# Patient Record
Sex: Female | Born: 1975 | Race: White | Hispanic: No | Smoking: Former smoker
Health system: Southern US, Community
[De-identification: ages and names within clinical notes are randomized; demographics above are authoritative.]

## PROBLEM LIST (undated history)

## (undated) ENCOUNTER — Inpatient Hospital Stay (HOSPITAL_COMMUNITY): Payer: Self-pay

## (undated) DIAGNOSIS — Z789 Other specified health status: Secondary | ICD-10-CM

## (undated) HISTORY — PX: HERNIA REPAIR: SHX51

## (undated) HISTORY — PX: GASTRIC BYPASS: SHX52

## (undated) HISTORY — PX: GALLBLADDER SURGERY: SHX652

---

## 1998-05-22 ENCOUNTER — Ambulatory Visit (HOSPITAL_COMMUNITY): Admission: RE | Admit: 1998-05-22 | Discharge: 1998-05-22 | Payer: Self-pay | Admitting: *Deleted

## 1998-06-17 ENCOUNTER — Ambulatory Visit (HOSPITAL_COMMUNITY): Admission: RE | Admit: 1998-06-17 | Discharge: 1998-06-17 | Payer: Self-pay | Admitting: *Deleted

## 1998-08-22 ENCOUNTER — Inpatient Hospital Stay (HOSPITAL_COMMUNITY): Admission: AD | Admit: 1998-08-22 | Discharge: 1998-08-22 | Payer: Self-pay | Admitting: *Deleted

## 1998-09-01 ENCOUNTER — Ambulatory Visit (HOSPITAL_COMMUNITY): Admission: RE | Admit: 1998-09-01 | Discharge: 1998-09-01 | Payer: Self-pay | Admitting: *Deleted

## 1998-10-04 ENCOUNTER — Inpatient Hospital Stay (HOSPITAL_COMMUNITY): Admission: AD | Admit: 1998-10-04 | Discharge: 1998-10-04 | Payer: Self-pay | Admitting: *Deleted

## 2011-01-17 ENCOUNTER — Inpatient Hospital Stay: Payer: Self-pay | Admitting: Obstetrics and Gynecology

## 2012-07-03 ENCOUNTER — Emergency Department (HOSPITAL_COMMUNITY)
Admission: EM | Admit: 2012-07-03 | Discharge: 2012-07-03 | Disposition: A | Discharge status: Home / Self Care | Payer: Self-pay | Attending: Emergency Medicine | Admitting: Emergency Medicine

## 2012-07-03 ENCOUNTER — Encounter (HOSPITAL_COMMUNITY): Payer: Self-pay | Admitting: *Deleted

## 2012-07-03 DIAGNOSIS — F17200 Nicotine dependence, unspecified, uncomplicated: Secondary | ICD-10-CM | POA: Insufficient documentation

## 2012-07-03 DIAGNOSIS — K089 Disorder of teeth and supporting structures, unspecified: Secondary | ICD-10-CM | POA: Insufficient documentation

## 2012-07-03 DIAGNOSIS — Z9884 Bariatric surgery status: Secondary | ICD-10-CM | POA: Insufficient documentation

## 2012-07-03 MED ORDER — HYDROCODONE-ACETAMINOPHEN 5-325 MG PO TABS: 1.0000 | ORAL_TABLET | ORAL | Status: DC | PRN

## 2012-07-03 MED ORDER — OMEPRAZOLE 20 MG PO CPDR: 20.0000 mg | DELAYED_RELEASE_CAPSULE | Freq: Every day | ORAL | Status: DC

## 2012-07-03 MED ORDER — PENICILLIN V POTASSIUM 500 MG PO TABS: 500.0000 mg | ORAL_TABLET | Freq: Four times a day (QID) | ORAL | Status: AC

## 2012-07-03 NOTE — ED Notes (Signed)
The pt has had a toothache for 2-3 weeks getting worse

## 2012-07-03 NOTE — ED Provider Notes (Signed)
History    This chart was scribed for Lyanne Co, MD by Gerlean Ren, ED Scribe. This patient was seen in room TR04C/TR04C and the patient's care was started at 9:39 PM    CSN: [id number]  Arrival date & time 07/03/12  2046   First MD Initiated Contact with Patient 07/03/12 2139      Chief Complaint  Patient presents with  . Dental Pain    The history is provided by the patient. No language interpreter was used.  Barbara Burton is a 37 y.o. female who presents to the Emergency Department complaining of 2 weeks of constant dental pain throughout entire mouth localized most specifically to upper left side that has been worsening over the past 2 days with associated left-sided facial swelling.  No OCM used for pain.  No fever.  Pt had prior gastric bypass surgery.  Past Medical History  Diagnosis Date  . Gastric bypass status for obesity     History reviewed. No pertinent past surgical history.  No family history on file.  History  Substance Use Topics  . Smoking status: Current Every Day Smoker  . Smokeless tobacco: Not on file  . Alcohol Use: No    No OB history provided.   Review of Systems A complete 10 system review of systems was obtained and all systems are negative except as noted in the HPI and PMH.   Allergies  Sulfa antibiotics  Home Medications   Current Outpatient Rx  Name  Route  Sig  Dispense  Refill  . acetaminophen (TYLENOL) 325 MG tablet   Oral   Take 650 mg by mouth every 6 (six) hours as needed for pain.           BP 118/68  Pulse 80  Temp(Src) 98.1 F (36.7 C) (Oral)  Resp 20  SpO2 100%  LMP 06/12/2012  Physical Exam  Nursing note and vitals reviewed. Constitutional: She is oriented to person, place, and time. She appears well-developed and well-nourished.  HENT:  Head: Normocephalic.  Left upper second molar tender, no gingival swelling, no gingival fluctuance  Eyes: EOM are normal.  Neck: Normal range of motion.  No  lymphadenopathy   Pulmonary/Chest: Effort normal.  Abdominal: She exhibits no distension.  Musculoskeletal: Normal range of motion.  Neurological: She is alert and oriented to person, place, and time.  Psychiatric: She has a normal mood and affect.    ED Course  Procedures (including critical care time) DIAGNOSTIC STUDIES: Oxygen Saturation is 100% on room air, normal by my interpretation.    COORDINATION OF CARE: 9:44 PM- Informed pt of plan to manage pain with pain medicine but that follow-up with dentist is needed.  Informed pt of financial assistance program here if dentist is contacted tomorrow.  Pt understands and agrees with plan.  1. Pain, dental       MDM  Dental Pain. Home with antibiotics and pain medicine. Recommend dental follow up. No signs of gingival abscess. Tolerating secretions. Airway patent. No sub lingular swelling   I personally performed the services described in this documentation, which was scribed in my presence. The recorded information has been reviewed and is accurate.          Lyanne Co, MD 07/03/12 2153

## 2013-02-05 ENCOUNTER — Inpatient Hospital Stay (HOSPITAL_COMMUNITY)
Admission: AD | Admit: 2013-02-05 | Discharge: 2013-02-05 | Disposition: A | Discharge status: Home / Self Care | Payer: Self-pay | Source: Ambulatory Visit | Attending: Obstetrics & Gynecology | Admitting: Obstetrics & Gynecology

## 2013-02-05 DIAGNOSIS — N926 Irregular menstruation, unspecified: Secondary | ICD-10-CM | POA: Insufficient documentation

## 2013-02-05 DIAGNOSIS — Z3202 Encounter for pregnancy test, result negative: Secondary | ICD-10-CM | POA: Insufficient documentation

## 2013-02-05 LAB — POCT PREGNANCY, URINE: Preg Test, Ur: NEGATIVE

## 2013-02-05 NOTE — MAU Provider Note (Signed)
Ms. Barbara Burton is a 37 y.o. female who presents to MAU today with concerns of possible pregnancy. The patient states that she did not have a period in June and had a abnormal period in July and August. LMP was 01/17/13 and was normal. The patient also states that she felt "a bump" in her lower abdomen that she has only felt previously in pregnancy. She denies any abdominal pain, vaginal bleeding or other GYN concerns today.   BP 121/66  Pulse 90  Temp(Src) 98.1 F (36.7 C) (Oral)  Resp 20  Ht 5' (1.524 m)  Wt 180 lb 2 oz (81.704 kg)  BMI 35.18 kg/m2 GENERAL: Well-developed, well-nourished female in no acute distress.  HEENT: Normocephalic, atraumatic.   LUNGS: Effort normal HEART: Regular rate  SKIN: Warm, dry and without erythema PSYCH: Normal mood and affect  Results for orders placed during the hospital encounter of 02/05/13 (from the past 24 hour(s))  POCT PREGNANCY, URINE     Status: None   Collection Time    02/05/13 11:35 PM      Result Value Range   Preg Test, Ur NEGATIVE  NEGATIVE    A: Negative pregnancy test Irregular menses  P: Discharge home Patient given contact information for [facility] clinic to make an appointment to establish care and further work-up of irregular cycles if she desires and symptoms persist Patient may return to MAU as needed  Freddi Starr, PA-C 02/05/2013 11:52 PM

## 2013-02-05 NOTE — MAU Note (Signed)
PT SAYS  NO CYCLE IN June,  IRREG CYCLE IN July  AND AUGUST,  HAD CYCLE IN 9-10.  LAST SEX- AUG.  NO BIRTH CONTROL, NO BLEEDING, NO PAIN.  HAD 4 BABIES IN [place] AND LAST PAP IN 2012[place].  DID 3 HPT-  June AND AUG-  ALL NEG.

## 2013-02-06 NOTE — MAU Provider Note (Signed)
Attestation of Attending Supervision of Advanced Practitioner (CNM/NP): Evaluation and management procedures were performed by the Advanced Practitioner under my supervision and collaboration.  I have reviewed the Advanced Practitioner's note and chart, and I agree with the management and plan.  HARRAWAY-Ringer, Laurie Penado 2:00 AM     

## 2013-05-22 ENCOUNTER — Encounter (HOSPITAL_COMMUNITY): Payer: Self-pay | Admitting: Emergency Medicine

## 2013-05-22 ENCOUNTER — Emergency Department (HOSPITAL_COMMUNITY)
Admission: EM | Admit: 2013-05-22 | Discharge: 2013-05-22 | Disposition: A | Discharge status: Home / Self Care | Payer: Medicaid Other | Attending: Emergency Medicine | Admitting: Emergency Medicine

## 2013-05-22 DIAGNOSIS — B349 Viral infection, unspecified: Secondary | ICD-10-CM

## 2013-05-22 DIAGNOSIS — IMO0001 Reserved for inherently not codable concepts without codable children: Secondary | ICD-10-CM | POA: Insufficient documentation

## 2013-05-22 DIAGNOSIS — F17200 Nicotine dependence, unspecified, uncomplicated: Secondary | ICD-10-CM | POA: Insufficient documentation

## 2013-05-22 DIAGNOSIS — R509 Fever, unspecified: Secondary | ICD-10-CM | POA: Insufficient documentation

## 2013-05-22 DIAGNOSIS — Z9884 Bariatric surgery status: Secondary | ICD-10-CM | POA: Insufficient documentation

## 2013-05-22 DIAGNOSIS — Z882 Allergy status to sulfonamides status: Secondary | ICD-10-CM | POA: Insufficient documentation

## 2013-05-22 DIAGNOSIS — R630 Anorexia: Secondary | ICD-10-CM | POA: Insufficient documentation

## 2013-05-22 DIAGNOSIS — R110 Nausea: Secondary | ICD-10-CM | POA: Insufficient documentation

## 2013-05-22 DIAGNOSIS — B9789 Other viral agents as the cause of diseases classified elsewhere: Secondary | ICD-10-CM | POA: Insufficient documentation

## 2013-05-22 MED ORDER — ONDANSETRON HCL 4 MG PO TABS: 4.0000 mg | ORAL_TABLET | Freq: Four times a day (QID) | ORAL | Status: DC

## 2013-05-22 MED ORDER — SODIUM CHLORIDE 0.9 % IV BOLUS (SEPSIS)
1000.0000 mL | Freq: Once | INTRAVENOUS | Status: AC
  Administered 2013-05-22: 1000 mL via INTRAVENOUS

## 2013-05-22 NOTE — ED Notes (Signed)
PA at bedside.

## 2013-05-22 NOTE — ED Notes (Signed)
Pt presents to department for evaluation of dizziness, lightheadedness, stuff nose, fever of 103.0 and generalized weakness. 5/10 headache at the time. Pt is alert and oriented x4. NAD.

## 2013-05-22 NOTE — Discharge Instructions (Signed)
Antibiotic Nonuse  Your caregiver felt that the infection or problem was not one that would be helped with an antibiotic. Infections may be caused by viruses or bacteria. Only a caregiver can tell which one of these is the likely cause of an illness. A cold is the most common cause of infection in both adults and children. A cold is a virus. Antibiotic treatment will have no effect on a viral infection. Viruses can lead to many lost days of work caring for sick children and many missed days of school. Children may catch as many as 10 "colds" or "flus" per year during which they can be tearful, cranky, and uncomfortable. The goal of treating a virus is aimed at keeping the ill person comfortable. Antibiotics are medications used to help the body fight bacterial infections. There are relatively few types of bacteria that cause infections but there are hundreds of viruses. While both viruses and bacteria cause infection they are very different types of germs. A viral infection will typically go away by itself within 7 to 10 days. Bacterial infections may spread or get worse without antibiotic treatment. Examples of bacterial infections are:  Sore throats (like strep throat or tonsillitis).  Infection in the lung (pneumonia).  Ear and skin infections. Examples of viral infections are:  Colds or flus.  Most coughs and bronchitis.  Sore throats not caused by Strep.  Runny noses. It is often best not to take an antibiotic when a viral infection is the cause of the problem. Antibiotics can kill off the helpful bacteria that we have inside our body and allow harmful bacteria to start growing. Antibiotics can cause side effects such as allergies, nausea, and diarrhea without helping to improve the symptoms of the viral infection. Additionally, repeated uses of antibiotics can cause bacteria inside of our body to become resistant. That resistance can be passed onto harmful bacterial. The next time you have  an infection it may be harder to treat if antibiotics are used when they are not needed. Not treating with antibiotics allows our own immune system to develop and take care of infections more efficiently. Also, antibiotics will work better for us when they are prescribed for bacterial infections. Treatments for a child that is ill may include:  Give extra fluids throughout the day to stay hydrated.  Get plenty of rest.  Only give your child over-the-counter or prescription medicines for pain, discomfort, or fever as directed by your caregiver.  The use of a cool mist humidifier may help stuffy noses.  Cold medications if suggested by your caregiver. Your caregiver may decide to start you on an antibiotic if:  The problem you were seen for today continues for a longer length of time than expected.  You develop a secondary bacterial infection. SEEK MEDICAL CARE IF:  Fever lasts longer than 5 days.  Symptoms continue to get worse after 5 to 7 days or become severe.  Difficulty in breathing develops.  Signs of dehydration develop (poor drinking, rare urinating, dark colored urine).  Changes in behavior or worsening tiredness (listlessness or lethargy). Document Released: 07/05/2001 Document Revised: 07/19/2011 Document Reviewed: 01/01/2009 [facility] Patient Information 2014 [place], [place]. Fever, Adult A fever is a higher than normal body temperature. In an adult, an oral temperature around 98.6 F (37 C) is considered normal. A temperature of 100.4 F (38 C) or higher is generally considered a fever. Mild or moderate fevers generally have no long-term effects and often do not require treatment. Extreme fever (  greater than or equal to 106 F or 41.1 C) can cause seizures. The sweating that may occur with repeated or prolonged fever may cause dehydration. Elderly people can develop confusion during a fever. A measured temperature can vary with:  Age.  Time of day.  Method of  measurement (mouth, underarm, rectal, or ear). The fever is confirmed by taking a temperature with a thermometer. Temperatures can be taken different ways. Some methods are accurate and some are not.  An oral temperature is used most commonly. Electronic thermometers are fast and accurate.  An ear temperature will only be accurate if the thermometer is positioned as recommended by the manufacturer.  A rectal temperature is accurate and done for those adults who have a condition where an oral temperature cannot be taken.  An underarm (axillary) temperature is not accurate and not recommended. Fever is a symptom, not a disease.  CAUSES   Infections commonly cause fever.  Some noninfectious causes for fever include:  Some arthritis conditions.  Some thyroid or adrenal gland conditions.  Some immune system conditions.  Some types of cancer.  A medicine reaction.  High doses of certain street drugs such as methamphetamine.  Dehydration.  Exposure to high outside or room temperatures.  Occasionally, the source of a fever cannot be determined. This is sometimes called a "fever of unknown origin" (FUO).  Some situations may lead to a temporary rise in body temperature that may go away on its own. Examples are:  Childbirth.  Surgery.  Intense exercise. HOME CARE INSTRUCTIONS   Take appropriate medicines for fever. Follow dosing instructions carefully. If you use acetaminophen to reduce the fever, be careful to avoid taking other medicines that also contain acetaminophen. Do not take aspirin for a fever if you are younger than age 77. There is an association with Reye's syndrome. Reye's syndrome is a rare but potentially deadly disease.  If an infection is present and antibiotics have been prescribed, take them as directed. Finish them even if you start to feel better.  Rest as needed.  Maintain an adequate fluid intake. To prevent dehydration during an illness with prolonged  or recurrent fever, you may need to drink extra fluid.Drink enough fluids to keep your urine clear or pale yellow.  Sponging or bathing with room temperature water may help reduce body temperature. Do not use ice water or alcohol sponge baths.  Dress comfortably, but do not over-bundle. SEEK MEDICAL CARE IF:   You are unable to keep fluids down.  You develop vomiting or diarrhea.  You are not feeling at least partly better after 3 days.  You develop new symptoms or problems. SEEK IMMEDIATE MEDICAL CARE IF:   You have shortness of breath or trouble breathing.  You develop excessive weakness.  You are dizzy or you faint.  You are extremely thirsty or you are making little or no urine.  You develop new pain that was not there before (such as in the head, neck, chest, back, or abdomen).  You have persistant vomiting and diarrhea for more than 1 to 2 days.  You develop a stiff neck or your eyes become sensitive to light.  You develop a skin rash.  You have a fever or persistent symptoms for more than 2 to 3 days.  You have a fever and your symptoms suddenly get worse. MAKE SURE YOU:   Understand these instructions.  Will watch your condition.  Will get help right away if you are not doing well or get  worse. Document Released: 10/20/2000 Document Revised: 07/19/2011 Document Reviewed: 02/25/2011 [facility] Patient Information 2014 [place], [place].

## 2013-05-22 NOTE — ED Provider Notes (Signed)
CSN: [id number]     Arrival date & time 05/22/13  1102 History   First MD Initiated Contact with Patient 05/22/13 1126     Chief Complaint  Patient presents with  . Dizziness  . Influenza   (Consider location/radiation/quality/duration/timing/severity/associated sxs/prior Treatment) Patient is a 38 y.o. female presenting with flu symptoms. The history is provided by the patient. No language interpreter was used.  Influenza Presenting symptoms: fever, myalgias and nausea   Presenting symptoms: no cough, no diarrhea, no shortness of breath, no sore throat and no vomiting   Associated symptoms: chills   Associated symptoms comment:  Symptoms that started 5 days ago with chills, fever to 103 and body aches. Symptoms have persisted and include nausea without vomiting. 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    Past Medical History  Diagnosis Date  . Gastric bypass status for obesity    History reviewed. No pertinent past surgical history. No family history on file. History  Substance Use Topics  . Smoking status: Current Every Day Smoker    Types: Cigarettes  . Smokeless tobacco: Not on file  . Alcohol Use: No   OB History   Grav Para Term Preterm Abortions TAB SAB Ect Mult Living                 Review of Systems  Constitutional: Positive for fever, chills and appetite change.  HENT: Negative for sore throat.   Respiratory: Negative for cough and shortness of breath.   Cardiovascular: Negative for chest pain.  Gastrointestinal: Positive for nausea. Negative for vomiting, abdominal pain and diarrhea.  Genitourinary: Negative.  Negative for dysuria.  Musculoskeletal: Positive for myalgias.  Neurological: Positive for dizziness and weakness.    Allergies  Sulfa antibiotics  Home Medications   Current Outpatient Rx  Name  Route  Sig  Dispense  Refill  . acetaminophen (TYLENOL) 500 MG tablet   Oral   Take 2,000 mg by mouth every 4 (four)  hours as needed for mild pain or moderate pain.         Marland Kitchen Phenyleph-CPM-DM-APAP (ALKA-SELTZER PLUS COLD & FLU PO)   Oral   Take 2 tablets by mouth every 6 (six) hours as needed (cold symptoms).          BP 120/72  Pulse 88  Temp(Src) 98.5 F (36.9 C) (Oral)  Resp 16  Wt 176 lb 1 oz (79.861 kg)  SpO2 100% Physical Exam  Constitutional: She is oriented to person, place, and time. She appears well-developed and well-nourished.  HENT:  Head: Normocephalic.  Mouth/Throat: Mucous membranes are dry. No oropharyngeal exudate.  Eyes: Conjunctivae are normal.  Neck: Normal range of motion. Neck supple.  Cardiovascular: Normal rate and regular rhythm.   Pulmonary/Chest: Effort normal and breath sounds normal.  Abdominal: Soft. Bowel sounds are normal. There is no tenderness. There is no rebound and no guarding.  Musculoskeletal: Normal range of motion.  Neurological: She is alert and oriented to person, place, and time.  Skin: Skin is warm and dry. No rash noted.  Psychiatric: She has a normal mood and affect.    ED Course  Procedures (including critical care time) Labs Review Labs Reviewed - No data to display Imaging Review No results found.  EKG Interpretation   None       MDM  No diagnosis found. 1. Viral illness 2. Fever  She is well appearing, drinking fluids. Lungs clear, normal oxygenation - doubt pneumonia.  Arnoldo HookerShari A Travers Goodley, PA-C 05/22/13 1222

## 2013-05-22 NOTE — ED Provider Notes (Signed)
Medical screening examination/treatment/procedure(s) were performed by non-physician practitioner and as supervising physician I was immediately available for consultation/collaboration.  Gilda Creasehristopher J. Pollina, MD 05/22/13 1224

## 2013-05-22 NOTE — ED Notes (Signed)
Pt reports generalized body aches, fever, and malaise since Friday. Reports mild nausea, but denies V/D. AO x4.

## 2013-10-18 ENCOUNTER — Encounter: Payer: Self-pay | Admitting: *Deleted

## 2013-10-18 ENCOUNTER — Ambulatory Visit (INDEPENDENT_AMBULATORY_CARE_PROVIDER_SITE_OTHER): Payer: Self-pay | Admitting: *Deleted

## 2013-10-18 VITALS — BP 106/68 | HR 68

## 2013-10-18 DIAGNOSIS — Z3201 Encounter for pregnancy test, result positive: Secondary | ICD-10-CM

## 2013-10-18 LAB — POCT PREGNANCY, URINE: Preg Test, Ur: POSITIVE — AB

## 2013-10-18 NOTE — Progress Notes (Signed)
Patient presented to clinic requesting pregnancy test. Test performed with positive result. Patient desires to begin her prenatal care with [place]. She has a certain lmp. Will wait to schedule anatomy scan closer to time frame needed. She requested preg verification letter, given to patient. New ob labs done.

## 2013-10-19 ENCOUNTER — Telehealth: Payer: Self-pay | Admitting: *Deleted

## 2013-10-19 ENCOUNTER — Encounter: Payer: Self-pay | Admitting: Obstetrics & Gynecology

## 2013-10-19 DIAGNOSIS — O99019 Anemia complicating pregnancy, unspecified trimester: Secondary | ICD-10-CM | POA: Insufficient documentation

## 2013-10-19 LAB — OBSTETRIC PANEL
Antibody Screen: NEGATIVE
BASOS ABS: 0.1 10*3/uL (ref 0.0–0.1)
Basophils Relative: 1 % (ref 0–1)
EOS ABS: 0.1 10*3/uL (ref 0.0–0.7)
EOS PCT: 2 % (ref 0–5)
HCT: 24 % — ABNORMAL LOW (ref 36.0–46.0)
Hemoglobin: 6.5 g/dL — CL (ref 12.0–15.0)
Hepatitis B Surface Ag: NEGATIVE
Lymphocytes Relative: 45 % (ref 12–46)
Lymphs Abs: 2.4 10*3/uL (ref 0.7–4.0)
MCH: 15.7 pg — AB (ref 26.0–34.0)
MCHC: 26.7 g/dL — ABNORMAL LOW (ref 30.0–36.0)
MCV: 57.7 fL — AB (ref 78.0–100.0)
Monocytes Absolute: 0.4 10*3/uL (ref 0.1–1.0)
Monocytes Relative: 8 % (ref 3–12)
Neutro Abs: 2.4 10*3/uL (ref 1.7–7.7)
Neutrophils Relative %: 44 % (ref 43–77)
Platelets: 388 10*3/uL (ref 150–400)
RBC: 4.15 MIL/uL (ref 3.87–5.11)
RDW: 19.9 % — AB (ref 11.5–15.5)
Rh Type: POSITIVE
Rubella: 1.12 Index — ABNORMAL HIGH (ref <0.90)
WBC: 5.4 10*3/uL (ref 4.0–10.5)

## 2013-10-19 LAB — HIV ANTIBODY (ROUTINE TESTING W REFLEX): HIV: NONREACTIVE

## 2013-10-19 NOTE — Telephone Encounter (Signed)
Message copied by Mannie StabileASH, AMANDA A on Fri Oct 19, 2013 11:33 AM ------      Message from: Jaynie CollinsANYANWU, [name]      Created: Fri Oct 19, 2013  9:57 AM       Patient needs to come in for anemia evaluation : check Ferritin, TIBC, B12 level, folate level, reticulocyte count, Hgb electrophoresis etc.  Needs OTC iron pills.  Ask about symptoms, if symptomatic, may need to come in for evaluation/IV iron/ transfusion. ------

## 2013-10-19 NOTE — Progress Notes (Signed)
Agree with nurses's documentation of this patient's clinic encounter.  Barbara NewcomerUgonna A Yesenia Fontenette, MD

## 2013-10-19 NOTE — Telephone Encounter (Signed)
Called patient and left message for her to call us back. She needs to come in for labs.

## 2013-10-20 LAB — PRESCRIPTION MONITORING PROFILE (19 PANEL)
AMPHETAMINE/METH: NEGATIVE ng/mL
BENZODIAZEPINE SCREEN, URINE: NEGATIVE ng/mL
BUPRENORPHINE, URINE: NEGATIVE ng/mL
Barbiturate Screen, Urine: NEGATIVE ng/mL
CARISOPRODOL, URINE: NEGATIVE ng/mL
Cannabinoid Scrn, Ur: NEGATIVE ng/mL
Cocaine Metabolites: NEGATIVE ng/mL
Creatinine, Urine: 187.22 mg/dL (ref >20.0)
Fentanyl, Ur: NEGATIVE ng/mL
MDMA URINE: NEGATIVE ng/mL
METHADONE SCREEN, URINE: NEGATIVE ng/mL
METHAQUALONE SCREEN (URINE): NEGATIVE ng/mL
Meperidine, Ur: NEGATIVE ng/mL
Nitrites, Initial: NEGATIVE ug/mL
Opiate Screen, Urine: NEGATIVE ng/mL
Oxycodone Screen, Ur: NEGATIVE ng/mL
PHENCYCLIDINE, UR: NEGATIVE ng/mL
Propoxyphene: NEGATIVE ng/mL
Tapentadol, urine: NEGATIVE ng/mL
Tramadol Scrn, Ur: NEGATIVE ng/mL
Zolpidem, Urine: NEGATIVE ng/mL
pH, Initial: 6.6 pH (ref 4.5–8.9)

## 2013-10-20 LAB — CULTURE, OB URINE

## 2013-10-22 ENCOUNTER — Other Ambulatory Visit: Payer: Self-pay | Admitting: *Deleted

## 2013-10-22 DIAGNOSIS — O99019 Anemia complicating pregnancy, unspecified trimester: Secondary | ICD-10-CM

## 2013-10-22 NOTE — Telephone Encounter (Signed)
Patient returned call. Attempted to call patient-- no answer-- left message stating we are returning your call, need to inform you of results and coordinate a lab appointment for you this week, please call clinic.

## 2013-10-22 NOTE — Telephone Encounter (Signed)
Called patient at her request on her work number. She is asymptomatic. She will come tomorrow afternoon for labs.

## 2013-10-23 ENCOUNTER — Other Ambulatory Visit: Payer: Medicaid Other

## 2013-10-23 DIAGNOSIS — O99019 Anemia complicating pregnancy, unspecified trimester: Secondary | ICD-10-CM

## 2013-10-23 LAB — RETICULOCYTES
ABS RETIC: 36.2 10*3/uL (ref 19.0–186.0)
RBC.: 4.53 MIL/uL (ref 3.87–5.11)
Retic Ct Pct: 0.8 % (ref 0.4–2.3)

## 2013-10-24 LAB — FERRITIN: Ferritin: 1 ng/mL — ABNORMAL LOW (ref 10–291)

## 2013-10-24 LAB — IRON AND TIBC
Iron: <10 ug/dL — ABNORMAL LOW (ref 42–145)
UIBC: 455 ug/dL — ABNORMAL HIGH (ref 125–400)

## 2013-10-24 LAB — FOLATE

## 2013-10-24 LAB — VITAMIN B12: Vitamin B-12: 889 pg/mL (ref 211–911)

## 2013-10-25 LAB — HEMOGLOBINOPATHY EVALUATION
HEMOGLOBIN OTHER: 0 %
HGB S QUANTITAION: 0 %
Hgb A2 Quant: 1.9 % — ABNORMAL LOW (ref 2.2–3.2)
Hgb A: 98.1 % — ABNORMAL HIGH (ref 96.8–97.8)
Hgb F Quant: 0 % (ref 0.0–2.0)

## 2013-10-30 ENCOUNTER — Other Ambulatory Visit: Payer: Self-pay | Admitting: Obstetrics & Gynecology

## 2013-10-30 DIAGNOSIS — O99019 Anemia complicating pregnancy, unspecified trimester: Secondary | ICD-10-CM

## 2013-10-30 MED ORDER — FERROUS SULFATE 325 (65 FE) MG PO TABS: 325.0000 mg | ORAL_TABLET | Freq: Three times a day (TID) | ORAL | Status: AC

## 2013-10-30 MED ORDER — DOCUSATE SODIUM 100 MG PO CAPS: 100.0000 mg | ORAL_CAPSULE | Freq: Two times a day (BID) | ORAL | Status: DC | PRN

## 2013-10-30 NOTE — Progress Notes (Signed)
Patient has iron deficiency anemia (please refer to labs). Iron pills and Colace prescribed.

## 2013-10-30 NOTE — Progress Notes (Addendum)
Called pt @ home# and left message that I am calling with test result information and recommendation from the doctor.  Please call back and leave message stating whether we can leave detailed information on your voice mail. Pt returned my call @ 1359 and stated that I may leave a detailed message on her voice mail. I called her back @ 1545 and left detailed message stating that she has iron deficiency anemia and requires iron supplement as well as stool softener. These medication have been sent to her pharmacy. She may purchase them as OTC if preferred. She may call back if she has additional questions.

## 2013-11-09 ENCOUNTER — Emergency Department (HOSPITAL_COMMUNITY)
Admission: EM | Admit: 2013-11-09 | Discharge: 2013-11-09 | Disposition: A | Discharge status: Home / Self Care | Payer: Medicaid Other | Attending: Emergency Medicine | Admitting: Emergency Medicine

## 2013-11-09 ENCOUNTER — Emergency Department (HOSPITAL_COMMUNITY): Payer: Medicaid Other

## 2013-11-09 ENCOUNTER — Encounter (HOSPITAL_COMMUNITY): Payer: Self-pay | Admitting: Emergency Medicine

## 2013-11-09 DIAGNOSIS — R112 Nausea with vomiting, unspecified: Secondary | ICD-10-CM

## 2013-11-09 DIAGNOSIS — O210 Mild hyperemesis gravidarum: Secondary | ICD-10-CM | POA: Insufficient documentation

## 2013-11-09 DIAGNOSIS — Z79899 Other long term (current) drug therapy: Secondary | ICD-10-CM | POA: Insufficient documentation

## 2013-11-09 DIAGNOSIS — R1012 Left upper quadrant pain: Secondary | ICD-10-CM | POA: Insufficient documentation

## 2013-11-09 DIAGNOSIS — O9989 Other specified diseases and conditions complicating pregnancy, childbirth and the puerperium: Secondary | ICD-10-CM | POA: Diagnosis present

## 2013-11-09 DIAGNOSIS — R1011 Right upper quadrant pain: Secondary | ICD-10-CM | POA: Diagnosis not present

## 2013-11-09 DIAGNOSIS — R1013 Epigastric pain: Secondary | ICD-10-CM | POA: Diagnosis not present

## 2013-11-09 DIAGNOSIS — Z9884 Bariatric surgery status: Secondary | ICD-10-CM | POA: Diagnosis not present

## 2013-11-09 DIAGNOSIS — O99330 Smoking (tobacco) complicating pregnancy, unspecified trimester: Secondary | ICD-10-CM | POA: Insufficient documentation

## 2013-11-09 LAB — CBC WITH DIFFERENTIAL/PLATELET
BASOS ABS: 0 10*3/uL (ref 0.0–0.1)
Basophils Relative: 0 % (ref 0–1)
EOS ABS: 0 10*3/uL (ref 0.0–0.7)
EOS PCT: 1 % (ref 0–5)
HCT: 34.5 % — ABNORMAL LOW (ref 36.0–46.0)
Hemoglobin: 9.9 g/dL — ABNORMAL LOW (ref 12.0–15.0)
LYMPHS ABS: 1.9 10*3/uL (ref 0.7–4.0)
Lymphocytes Relative: 22 % (ref 12–46)
MCH: 18.8 pg — ABNORMAL LOW (ref 26.0–34.0)
MCHC: 28.7 g/dL — ABNORMAL LOW (ref 30.0–36.0)
MCV: 65.5 fL — ABNORMAL LOW (ref 78.0–100.0)
Monocytes Absolute: 0.6 10*3/uL (ref 0.1–1.0)
Monocytes Relative: 8 % (ref 3–12)
NEUTROS PCT: 69 % (ref 43–77)
Neutro Abs: 5.8 10*3/uL (ref 1.7–7.7)
PLATELETS: 408 10*3/uL — AB (ref 150–400)
RBC: 5.27 MIL/uL — ABNORMAL HIGH (ref 3.87–5.11)
RDW: 28.5 % — AB (ref 11.5–15.5)
WBC: 8.3 10*3/uL (ref 4.0–10.5)

## 2013-11-09 LAB — COMPREHENSIVE METABOLIC PANEL
ALK PHOS: 60 U/L (ref 39–117)
ALT: 14 U/L (ref 0–35)
AST: 18 U/L (ref 0–37)
Albumin: 3.9 g/dL (ref 3.5–5.2)
Anion gap: 13 (ref 5–15)
BUN: 6 mg/dL (ref 6–23)
CO2: 22 meq/L (ref 19–32)
Calcium: 9.5 mg/dL (ref 8.4–10.5)
Chloride: 102 mEq/L (ref 96–112)
Creatinine, Ser: 0.45 mg/dL — ABNORMAL LOW (ref 0.50–1.10)
GFR calc non Af Amer: >90 mL/min (ref >90)
GLUCOSE: 96 mg/dL (ref 70–99)
POTASSIUM: 3.7 meq/L (ref 3.7–5.3)
SODIUM: 137 meq/L (ref 137–147)
TOTAL PROTEIN: 7.6 g/dL (ref 6.0–8.3)
Total Bilirubin: 0.5 mg/dL (ref 0.3–1.2)

## 2013-11-09 LAB — URINALYSIS, ROUTINE W REFLEX MICROSCOPIC
Bilirubin Urine: NEGATIVE
Glucose, UA: NEGATIVE mg/dL
Hgb urine dipstick: NEGATIVE
Leukocytes, UA: NEGATIVE
NITRITE: NEGATIVE
PROTEIN: NEGATIVE mg/dL
SPECIFIC GRAVITY, URINE: 1.015 (ref 1.005–1.030)
UROBILINOGEN UA: 1 mg/dL (ref 0.0–1.0)
pH: 7 (ref 5.0–8.0)

## 2013-11-09 LAB — LIPASE, BLOOD: Lipase: 34 U/L (ref 11–59)

## 2013-11-09 LAB — HCG, QUANTITATIVE, PREGNANCY: hCG, Beta Chain, Quant, S: 91962 m[IU]/mL — ABNORMAL HIGH (ref <5)

## 2013-11-09 MED ORDER — SODIUM CHLORIDE 0.9 % IV SOLN: INTRAVENOUS | Status: DC

## 2013-11-09 MED ORDER — CALCIUM CARBONATE ANTACID 500 MG PO CHEW
1.0000 | CHEWABLE_TABLET | Freq: Once | ORAL | Status: AC
  Administered 2013-11-09: 200 mg via ORAL
  Filled 2013-11-09: qty 1

## 2013-11-09 MED ORDER — ONDANSETRON HCL 4 MG/2ML IJ SOLN
4.0000 mg | Freq: Once | INTRAMUSCULAR | Status: AC
  Administered 2013-11-09: 4 mg via INTRAVENOUS
  Filled 2013-11-09: qty 2

## 2013-11-09 MED ORDER — FAMOTIDINE IN NACL 20-0.9 MG/50ML-% IV SOLN
20.0000 mg | Freq: Once | INTRAVENOUS | Status: AC
  Administered 2013-11-09: 20 mg via INTRAVENOUS
  Filled 2013-11-09: qty 50

## 2013-11-09 MED ORDER — ACETAMINOPHEN 325 MG PO TABS
650.0000 mg | ORAL_TABLET | Freq: Once | ORAL | Status: AC
  Administered 2013-11-09: 650 mg via ORAL
  Filled 2013-11-09: qty 2

## 2013-11-09 MED ORDER — PROMETHAZINE HCL 25 MG/ML IJ SOLN
12.5000 mg | Freq: Once | INTRAMUSCULAR | Status: AC
  Administered 2013-11-09: 12.5 mg via INTRAVENOUS
  Filled 2013-11-09: qty 1

## 2013-11-09 MED ORDER — SODIUM CHLORIDE 0.9 % IV BOLUS (SEPSIS)
1000.0000 mL | Freq: Once | INTRAVENOUS | Status: AC
  Administered 2013-11-09: 1000 mL via INTRAVENOUS

## 2013-11-09 MED ORDER — METOCLOPRAMIDE HCL 10 MG PO TABS: 10.0000 mg | ORAL_TABLET | Freq: Four times a day (QID) | ORAL | Status: DC | PRN

## 2013-11-09 NOTE — ED Notes (Signed)
Abdominal and back pain since yesterday.  8 1/[redacted] weeks pregnant.  VSS.  States last time she had this same pain her blood counts were low and she had to have a blood transfusion.

## 2013-11-09 NOTE — ED Provider Notes (Signed)
CSN: [id number]     Arrival date & time 11/09/13  1128 History   First MD Initiated Contact with Patient 11/09/13 1312     Chief Complaint  Patient presents with  . Abdominal Pain      HPI Pt was seen at 1310. Per pt, c/o gradual onset and persistence of constant upper abd "pain" since yesterday. Has been associated with several intermittent episodes of N/V. Pain worsens after eating, and radiates into her back. 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 Pt denies lower abd/pelvic pain. Denies diarrhea, no black or blood in stools or emesis, no vaginal bleeding/discharge, no dysuria/hematuria, no CP/SOB, no fevers, no rash.    History reviewed. No pertinent past medical history.  Past Surgical History  Procedure Laterality Date  . Gastric bypass      History  Substance Use Topics  . Smoking status: Current Every Day Smoker    Types: Cigarettes  . Smokeless tobacco: Not on file  . Alcohol Use: No   OB History   Grav Para Term Preterm Abortions TAB SAB Ect Mult Living   5         4     Review of Systems ROS: Statement: All systems negative except as marked or noted in the HPI; Constitutional: Negative for fever and chills. ; ; Eyes: Negative for eye pain, redness and discharge. ; ; ENMT: Negative for ear pain, hoarseness, nasal congestion, sinus pressure and sore throat. ; ; Cardiovascular: Negative for chest pain, palpitations, diaphoresis, dyspnea and peripheral edema. ; ; Respiratory: Negative for cough, wheezing and stridor. ; ; Gastrointestinal: +N/V, abd pain. Negative for diarrhea, blood in stool, hematemesis, jaundice and rectal bleeding. . ; ; Genitourinary: Negative for dysuria, flank pain and hematuria. ; ; Musculoskeletal: Negative for back pain and neck pain. Negative for swelling and trauma.; ; Skin: Negative for pruritus, rash, abrasions, blisters, bruising and skin lesion.; ; Neuro: Negative for headache, lightheadedness and neck stiffness. Negative for weakness,  altered level of consciousness , altered mental status, extremity weakness, paresthesias, involuntary movement, seizure and syncope.        Allergies  Sulfa antibiotics  Home Medications   Prior to Admission medications   Medication Sig Start Date End Date Taking? Authorizing Provider  acetaminophen (TYLENOL) 500 MG tablet Take 2,000 mg by mouth every 4 (four) hours as needed for mild pain or moderate pain.    Historical Provider, MD  docusate sodium (COLACE) 100 MG capsule Take 1 capsule (100 mg total) by mouth 2 (two) times daily as needed. 10/30/13   Tereso Newcomer, MD  ferrous sulfate (FERROUSUL) 325 (65 FE) MG tablet Take 1 tablet (325 mg total) by mouth 3 (three) times daily with meals. 10/30/13   Tereso Newcomer, MD  ondansetron (ZOFRAN) 4 MG tablet Take 1 tablet (4 mg total) by mouth every 6 (six) hours. 05/22/13   Shari A Upstill, PA-C  Phenyleph-CPM-DM-APAP (ALKA-SELTZER PLUS COLD & FLU PO) Take 2 tablets by mouth every 6 (six) hours as needed (cold symptoms).    Historical Provider, MD   BP 124/76  Pulse 58  Temp(Src) 98.1 F (36.7 C) (Oral)  Resp 18  Ht 5' (1.524 m)  Wt 180 lb (81.647 kg)  BMI 35.15 kg/m2  SpO2 100%  LMP 09/09/2013 Physical Exam 1315: Physical examination:  Nursing notes reviewed; Vital signs and O2 SAT reviewed;  Constitutional: Well developed, Well nourished, Well hydrated, Uncomfortable appearing.; Head:  Normocephalic, atraumatic; Eyes: EOMI, PERRL, No  scleral icterus; ENMT: Mouth and pharynx normal, Mucous membranes moist; Neck: Supple, Full range of motion, No lymphadenopathy; Cardiovascular: Regular rate and rhythm, No murmur, rub, or gallop; Respiratory: Breath sounds clear & equal bilaterally, No rales, rhonchi, wheezes.  Speaking full sentences with ease, Normal respiratory effort/excursion; Chest: Nontender, Movement normal; Abdomen: Soft, +RUQ, mid-epigastric, LUQ tenderness to palp. No rebound or guarding. Nondistended, Normal bowel sounds;  Genitourinary: No CVA tenderness; Extremities: Pulses normal, No tenderness, No edema, No calf edema or asymmetry.; Neuro: AA&Ox3, Major CN grossly intact.  Speech clear. No gross focal motor or sensory deficits in extremities.; Skin: Color normal, Warm, Dry.   ED Course  Procedures     MDM  MDM Reviewed: previous chart, nursing note and vitals Reviewed previous: labs Interpretation: labs and ultrasound    Results for orders placed during the hospital encounter of 11/09/13  URINALYSIS, ROUTINE W REFLEX MICROSCOPIC      Result Value Ref Range   Color, Urine YELLOW  YELLOW   APPearance CLEAR  CLEAR   Specific Gravity, Urine 1.015  1.005 - 1.030   pH 7.0  5.0 - 8.0   Glucose, UA NEGATIVE  NEGATIVE mg/dL   Hgb urine dipstick NEGATIVE  NEGATIVE   Bilirubin Urine NEGATIVE  NEGATIVE   Ketones, ur TRACE (*) NEGATIVE mg/dL   Protein, ur NEGATIVE  NEGATIVE mg/dL   Urobilinogen, UA 1.0  0.0 - 1.0 mg/dL   Nitrite NEGATIVE  NEGATIVE   Leukocytes, UA NEGATIVE  NEGATIVE  CBC WITH DIFFERENTIAL      Result Value Ref Range   WBC 8.3  4.0 - 10.5 K/uL   RBC 5.27 (*) 3.87 - 5.11 MIL/uL   Hemoglobin 9.9 (*) 12.0 - 15.0 g/dL   HCT 09.834.5 (*) 11.936.0 - 14.746.0 %   MCV 65.5 (*) 78.0 - 100.0 fL   MCH 18.8 (*) 26.0 - 34.0 pg   MCHC 28.7 (*) 30.0 - 36.0 g/dL   RDW 82.928.5 (*) 56.211.5 - 13.015.5 %   Platelets 408 (*) 150 - 400 K/uL   Neutrophils Relative % 69  43 - 77 %   Neutro Abs 5.8  1.7 - 7.7 K/uL   Lymphocytes Relative 22  12 - 46 %   Lymphs Abs 1.9  0.7 - 4.0 K/uL   Monocytes Relative 8  3 - 12 %   Monocytes Absolute 0.6  0.1 - 1.0 K/uL   Eosinophils Relative 1  0 - 5 %   Eosinophils Absolute 0.0  0.0 - 0.7 K/uL   Basophils Relative 0  0 - 1 %   Basophils Absolute 0.0  0.0 - 0.1 K/uL  COMPREHENSIVE METABOLIC PANEL      Result Value Ref Range   Sodium 137  137 - 147 mEq/L   Potassium 3.7  3.7 - 5.3 mEq/L   Chloride 102  96 - 112 mEq/L   CO2 22  19 - 32 mEq/L   Glucose, Bld 96  70 - 99 mg/dL   BUN 6   6 - 23 mg/dL   Creatinine, Ser 8.650.45 (*) 0.50 - 1.10 mg/dL   Calcium 9.5  8.4 - 78.410.5 mg/dL   Total Protein 7.6  6.0 - 8.3 g/dL   Albumin 3.9  3.5 - 5.2 g/dL   AST 18  0 - 37 U/L   ALT 14  0 - 35 U/L   Alkaline Phosphatase 60  39 - 117 U/L   Total Bilirubin 0.5  0.3 - 1.2 mg/dL   GFR calc  non Af Amer >90  >90 mL/min   GFR calc Af Amer >90  >90 mL/min   Anion gap 13  5 - 15  HCG, QUANTITATIVE, PREGNANCY      Result Value Ref Range   hCG, Beta Chain, Quant, [place] [id number] (*) <5 mIU/mL  LIPASE, BLOOD      Result Value Ref Range   Lipase 34  11 - 59 U/L   [place] Abdomen Complete 11/09/2013   CLINICAL DATA:  Nausea vomiting and abdominal pain ; history of cholecystectomy; currently pregnant  EXAM: ULTRASOUND ABDOMEN COMPLETE  COMPARISON:  None.  FINDINGS: Gallbladder:  The gallbladder is surgically absent. There is no tenderness reported in the right upper quadrant.  Common bile duct:  Diameter: 3.7 mm  Liver:  No focal lesion identified. Within normal limits in parenchymal echogenicity.  IVC:  No abnormality visualized.  Pancreas:  Visualized portion unremarkable.  Spleen:  Size and appearance within normal limits.  Right Kidney:  Length: 13.6 cm. Echogenicity within normal limits. No mass or hydronephrosis visualized.  Left Kidney:  Length: 12.5 cm. Echogenicity within normal limits. No mass or hydronephrosis visualized.  Abdominal aorta:  No aneurysm visualized.  Other findings:  No ascites is demonstrated.  IMPRESSION: Normal abdominal ultrasound examination. The gallbladder is surgically absent.   Electronically Signed   By: David  [place]azil[place]   On: 11/09/2013 14:34    1700:   Workup reassuring. Pt continues to deny any lower abd/pelvic pain, vaginal bleeding/discharge, etc.  Has tol PO well while in the ED without N/V. No stooling while in the ED. Pt states she feels better after meds and wants to go home now. Will continue to tx symptomatically at this time. Dx and testing d/w pt and family.  Questions  answered.  Verb understanding, agreeable to d/c home with outpt f/u.    Laray AngerKathleen M Cyriah Childrey, DO 11/12/13 [phone]

## 2013-11-09 NOTE — Discharge Instructions (Signed)
°Emergency Department Resource Guide °1) Find a Doctor and Pay Out of Pocket °Although you won't have to find out who is covered by your insurance plan, it is a good idea to ask around and get recommendations. You will then need to call the office and see if the doctor you have chosen will accept you as a new patient and what types of options they offer for patients who are self-pay. Some doctors offer discounts or will set up payment plans for their patients who do not have insurance, but you will need to ask so you aren't surprised when you get to your appointment. ° °2) Contact Your Local Health Department °Not all health departments have doctors that can see patients for sick visits, but many do, so it is worth a call to see if yours does. If you don't know where your local health department is, you can check in your phone book. The CDC also has a tool to help you locate your state's health department, and many state websites also have listings of all of their local health departments. ° °3) Find a Walk-in Clinic °If your illness is not likely to be very severe or complicated, you may want to try a walk in clinic. These are popping up all over the country in pharmacies, drugstores, and shopping centers. They're usually staffed by nurse practitioners or physician assistants that have been trained to treat common illnesses and complaints. They're usually fairly quick and inexpensive. However, if you have serious medical issues or chronic medical problems, these are probably not your best option. ° °No Primary Care Doctor: °- Call [facility] Connect at  [phone] - they can help you locate a primary care doctor that  accepts your insurance, provides certain services, etc. °- Physician Referral Service- 1-800-533-3463 ° °Chronic Pain Problems: °Organization         Address  Phone   Notes  °[facility]  ([phone]) [phone] Patients need to be referred by their primary care doctor.  ° °Medication  Assistance: °Organization         Address  Phone   Notes  °[place] [facility] [address] °[place], [place] [address] (336) 641-8030 --Must be a resident of [place] °-- Must have NO insurance coverage whatsoever (no Medicaid/ Medicare, etc.) °-- The pt. MUST have a primary care doctor that directs their care regularly and follows them in the community °  °[facility]  (866) 331-1348   °[employer]  (888) 892-1162   ° °Agencies that provide inexpensive medical care: °Organization         Address  Phone   Notes  °[facility]  (336) 832-8035   °[facility]    (336) 832-7272   °[facility] [address] °[place], [place] [address] (336) 832-4777   °[facility] [address], °[place] (336) 271-4999   °[facility]    (336) 373-0678   °[facility]    (336) 272-1050   °[facility] ° [address], [place] Phone:  (336) 832-4444, Fax:  (336) 832-4440 Hours of Operation:  9 am - 6 pm, M-F.  Also accepts Medicaid/Medicare and self-pay.  °[facility] ° [address], [place] Phone: (336) [phone], Fax: (336) [phone]. Hours of Operation:  8:30 am - 5:30 pm, M-F.  Also accepts Medicaid and self-pay.  °[facility] [place] [address], [place] Phone: (336) 878-6027   °[facility] [address], [place], [place] (336)723-1848, Ext. 123 Mondays & Thursdays: 7-9 AM.  First 15 patients are seen on a first come, first serve basis. °  ° °Medicaid-accepting [place] Providers: ° °Organization         Address  Phone   Notes  °[facility] [address], [place] (336) 641-2100 Also accepts self-pay patients.  °[facility] [address], [place] ° (336) 856-9996   °[facility] [address], [place]  (336) 288-8857   °[facility] [address] [place] [address], [place] (336) 299-7000   °Veita Bland [address], [place]  ° (336) 373-1557 Only accepts [place] Access Medicaid patients after they have their name applied to their card.  ° °Self-Pay (no insurance) in [place]: ° °Organization         Address  Phone   Notes  °Sickle Cell Patients, [facility] [address], [place] (336) 832-1970   °[facility] [address], [place] (336) 832-4400   °[facility] ° [address], [place] (336) 992-4800   °[facility]/Dr. Osei-Bonsu ° [address] [place] [address], [place] or [address], [place] (336) [phone] Phone number for both [place] and [place] locations is the same.  °[facility] [address], [place] (336) 299-0000   °[facility] [address] [place] [address], [place] or [address] (336) 852-7530 °(336) 878-2260   °[facility] [address], [place] (336) 350-1642, phone; (336) 294-5005, fax Sees patients 1st and 3rd Saturday of every month.  Must not qualify for public or private insurance (i.e. Medicaid, Medicare, [place] Health Choice, Veterans' Benefits) • Household income should be no more than 200% of the poverty level •The clinic cannot treat you if you are pregnant or think you are pregnant • Sexually transmitted diseases are not treated at the clinic.  ° ° °Dental Care: °Organization         Address  Phone  Notes  °[place] [facility] [address], [place] (336) 641-6152 Accepts children up to age 21 who are enrolled in Medicaid or [place] Health Choice; pregnant women with a Medicaid card; and children who have applied for Medicaid or [place] Health Choice, but were declined, whose parents can pay a reduced fee at time of service.  °[place]  [facility] [place]  [address], [place] (336) 641-7733 Accepts children up to age 21 who are enrolled in Medicaid or [place] Health Choice; pregnant women with a Medicaid card; and children who have applied for Medicaid or [place] Health Choice, but were declined, whose parents can pay a reduced fee at time of service.  °[facility] ° [address], [place] (336) 641-4533 Patients are seen by appointment only. Walk-ins are not accepted. [facility] will see patients 18 years of age and older. °Monday - Tuesday (8am-5pm) °Most Wednesdays (8:30-5pm) °$30 per visit, cash only  °[facility] ° [address], [place] (336) 641-4533 Patients are seen by appointment only. Walk-ins are not accepted. [facility] will see patients 18 years of age and older. °One   Wednesday Evening (Monthly: Volunteer Based).  $30 per visit, cash only  °[facility]  (919) [phone] for adults; Children under age 4, call Graduate Pediatric Dentistry at ([phone]) [phone]. Children aged 4-14, please call ([phone]) [phone] to request a pediatric application. ° Dental services are provided in all areas of dental care including fillings, crowns and bridges, complete and partial dentures, implants, gum treatment, root canals, and extractions. Preventive care is also provided. Treatment is provided to both adults and children. °Patients are selected via a lottery and there is often a waiting list. °  °[facility] [address], °[place] ° (336) 763-8833 [email or web address] °  °[facility] [address], [place], [place] (336)723-1848, Ext. 123 Second and Fourth Thursday of each month, opens at 6:30 AM; Clinic ends at 9 AM.  Patients are seen on a first-come first-served basis, and a limited number are seen during each clinic.  ° °[facility] ° [address], [place], [place] (336) [phone]    Eligibility Requirements °You must have lived in [place], [place], or [place] counties for at least the last three months. °  You cannot be eligible for state or federal sponsored healthcare insurance, including Veterans Administration, Medicaid, or Medicare. °  You generally cannot be eligible for healthcare insurance through your employer.  °  How to apply: °Eligibility screenings are held every Tuesday and Wednesday afternoon from 1:00 pm until 4:00 pm. You do not need an appointment for the interview!  °[facility] [address], [place], [place] 336-631-2330   °[facility]  336-342-8273   °[facility]  336-703-3100   °[facility]  336-570-6415   ° °Behavioral Health Resources in the Community: °Intensive Outpatient Programs °Organization         Address  Phone  Notes  °[facility] [address], [place], [place] 336-878-6098   °[facility] [address], [place], [place] 336-832-9800   °[facility] [address], [place], [place] ° 336-882-2125   °[facility] [address],  °[place], [place] 1-800-853-5163 or 336-641-4981   °Substance Abuse Resources °Organization         Address  Phone  Notes  °[facility]  336-882-2125   °[facility]  336-784-9470   °[facility]  336-285-9073   °[facility]  336-845-3988   °Residential & Outpatient Substance Abuse Program  1-800-659-3381   °Psychological Services °Organization         Address  Phone  Notes  °[facility]  336- 832-9600   °[facility]  336- 378-7881   °[facility] [address], [place] 1-800-853-5163 or 336-641-4981   ° °Mobile Crisis Teams °Organization         Address  Phone  Notes  °[facility], Mobile Crisis Care Unit  1-877-626-1772   °Assertive °[facility] ° [address]  [place], [place] 336-834-9664   °Sharon DeEsch [address] °[place] 336-554-5454   ° °Self-Help/Support Groups °Organization         Address  Phone             Notes  °[facility] of  - variety of support groups  336- 373-1402 Call for more information  °Narcotics Anonymous (NA), [facility] [address], °[place]  2 meetings at this location  ° °  Residential Treatment Programs Organization         Address  Phone  Notes  [facility] [address],    [place]  [phone]-(509)[phone]   [facility]  [address], [place] [phone], [place], [place] [phone]   [facility] [address], [place] [phone] Admissions: 8am-3pm M-F  [facility] [address]    [place], [place] [phone]   [facility] [address], [place], [place] [phone]   [facility] [address]  [place], [place] [phone]   [facility] Intensive Outpatient [address], [place], [place] [phone]   [facility] [address]  [place], [place] [phone] or [phone]   [facility] [address] [place], [place] [phone] Accepts Medicaid  [facility] [address]  [place] [phone] Substance Abuse/Addiction Treatment   [facility]         Address  Phone  Notes  [facility]  [phone](888) 4808192301   Angie FavaJulie Brannon, PhD [address], [address] [place], [place]   ([phone](336) [phone] or [phone](336) [phone]   [facility]   [address] [place], [place] [phone](336) [phone]   [facility] [address], [place], [place] [phone](336) [phone] Insurance/Medicaid/sponsorship through [facility]  [facility] [address]                                    [place], [place] [phone](336) [phone] Therapy/tele-psych/case    [facility] [address]   [place], [place] [phone]) [phone]    Dr. Lolly MustacheArfeen  [phone]) [phone]   [facility]  [employer] [facility] 1) [address], [place] 2) [address], [place] 3)  [address], [place] [phone](336) [phone](336) [phone]  [phone]) [phone]   [facility] Child [facility] Hot[facility] 6816774370(336) (217) [phone] or (774)[phone](336) [phone] (After Hours)       Increase your fluids (ie: Gatorade) for the next few days. Eat a bland diet, avoiding greasy, fatty, fried foods, as well as spicy and acidic foods or beverages.  Avoid eating within the hour or 2 before going to bed or laying down.  Also avoid teas, colas, coffee, chocolate, pepermint and spearment.  Take over the counter pepcid, one tablet by mouth twice a day, for the next 2 to 3 weeks.  May also take over the counter maalox/mylanta, as directed on packaging, as needed for discomfort.  Take the prescription as directed.  Call your regular OB/GYN doctor on Monday to schedule a follow up appointment within the next 3 days.  Return to the Emergency Department immediately if worsening.

## 2013-11-11 ENCOUNTER — Encounter (HOSPITAL_COMMUNITY): Payer: Self-pay | Admitting: *Deleted

## 2013-11-11 ENCOUNTER — Inpatient Hospital Stay (HOSPITAL_COMMUNITY)
Admission: AD | Admit: 2013-11-11 | Discharge: 2013-11-11 | Disposition: A | Discharge status: Home / Self Care | Payer: Medicaid Other | Source: Ambulatory Visit | Attending: Obstetrics & Gynecology | Admitting: Obstetrics & Gynecology

## 2013-11-11 DIAGNOSIS — Z9884 Bariatric surgery status: Secondary | ICD-10-CM | POA: Insufficient documentation

## 2013-11-11 DIAGNOSIS — K219 Gastro-esophageal reflux disease without esophagitis: Secondary | ICD-10-CM | POA: Diagnosis not present

## 2013-11-11 DIAGNOSIS — O99891 Other specified diseases and conditions complicating pregnancy: Secondary | ICD-10-CM | POA: Diagnosis not present

## 2013-11-11 DIAGNOSIS — R109 Unspecified abdominal pain: Secondary | ICD-10-CM | POA: Diagnosis present

## 2013-11-11 DIAGNOSIS — O9989 Other specified diseases and conditions complicating pregnancy, childbirth and the puerperium: Secondary | ICD-10-CM

## 2013-11-11 DIAGNOSIS — Z87891 Personal history of nicotine dependence: Secondary | ICD-10-CM | POA: Diagnosis not present

## 2013-11-11 DIAGNOSIS — O26899 Other specified pregnancy related conditions, unspecified trimester: Secondary | ICD-10-CM

## 2013-11-11 HISTORY — DX: Other specified health status: Z78.9

## 2013-11-11 LAB — URINALYSIS, ROUTINE W REFLEX MICROSCOPIC
Bilirubin Urine: NEGATIVE
Glucose, UA: NEGATIVE mg/dL
HGB URINE DIPSTICK: NEGATIVE
Ketones, ur: NEGATIVE mg/dL
Leukocytes, UA: NEGATIVE
Nitrite: NEGATIVE
PH: 6.5 (ref 5.0–8.0)
Protein, ur: NEGATIVE mg/dL
SPECIFIC GRAVITY, URINE: 1.015 (ref 1.005–1.030)
Urobilinogen, UA: 2 mg/dL — ABNORMAL HIGH (ref 0.0–1.0)

## 2013-11-11 MED ORDER — FAMOTIDINE 20 MG PO TABS
20.0000 mg | ORAL_TABLET | Freq: Two times a day (BID) | ORAL | Status: DC
Start: 2013-11-11 — End: 2013-11-11

## 2013-11-11 MED ORDER — OXYCODONE-ACETAMINOPHEN 5-325 MG PO TABS
1.0000 | ORAL_TABLET | Freq: Once | ORAL | Status: AC
  Administered 2013-11-11: 1 via ORAL
  Filled 2013-11-11: qty 1

## 2013-11-11 MED ORDER — GI COCKTAIL ~~LOC~~
30.0000 mL | Freq: Once | ORAL | Status: AC
  Administered 2013-11-11: 30 mL via ORAL
  Filled 2013-11-11: qty 30

## 2013-11-11 MED ORDER — OXYCODONE-ACETAMINOPHEN 5-325 MG PO TABS: 1.0000 | ORAL_TABLET | Freq: Once | ORAL | Status: DC

## 2013-11-11 MED ORDER — FAMOTIDINE 20 MG PO TABS: 20.0000 mg | ORAL_TABLET | Freq: Two times a day (BID) | ORAL | Status: DC

## 2013-11-11 MED ORDER — FAMOTIDINE IN NACL 20-0.9 MG/50ML-% IV SOLN
20.0000 mg | Freq: Once | INTRAVENOUS | Status: AC
  Administered 2013-11-11: 20 mg via INTRAVENOUS
  Filled 2013-11-11: qty 50

## 2013-11-11 MED ORDER — METOCLOPRAMIDE HCL 10 MG PO TABS: 10.0000 mg | ORAL_TABLET | Freq: Four times a day (QID) | ORAL | Status: DC

## 2013-11-11 MED ORDER — LACTATED RINGERS IV BOLUS (SEPSIS)
1000.0000 mL | Freq: Once | INTRAVENOUS | Status: AC
  Administered 2013-11-11: 1000 mL via INTRAVENOUS

## 2013-11-11 MED ORDER — METOCLOPRAMIDE HCL 5 MG/ML IJ SOLN
10.0000 mg | Freq: Once | INTRAMUSCULAR | Status: AC
  Administered 2013-11-11: 10 mg via INTRAVENOUS
  Filled 2013-11-11: qty 2

## 2013-11-11 NOTE — MAU Note (Signed)
I've had upper abd pain since THurs . [facility] and told was stomach irritation. 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 Treated for small gastric ulcer before gastric bypass. Does not have gallbladder. Has had this pain before but usually doesn't last this long. Sometimes pain radiates to back.

## 2013-11-11 NOTE — MAU Provider Note (Signed)
History     CSN: [id number]  Arrival date and time: 11/11/13 0024   First Provider Initiated Contact with Patient 11/11/13 0109      Chief Complaint  Patient presents with  . Abdominal Pain   HPI 38 y.o. G5P0 at 5774w0d with upper abdominal pain x 3 days, epigastric and bilateral upper quadrants, radiates to back. Gets worse with eating. No n/v. H/O gastric bypass surgery, states she had "small gastric ulcer" treated prior to gastric bypass. Gallbladder is surgically absent. Pt was seen at [facility] yesterday for same c/o - nml CBC (except ongoing anemia), CMP, abd u/s. Pt was given Pepcid and reglan IV, states today that the medication did not help, but per note last night pain decreased from 9/10 to 2/10 during her stay at [facility].   Past Medical History  Diagnosis Date  . Medical history non-contributory     Past Surgical History  Procedure Laterality Date  . Gastric bypass    . Cesarean section    . Gallbladder surgery      Family History  Problem Relation Age of Onset  . Diabetes Mother     History  Substance Use Topics  . Smoking status: Former Smoker    Types: Cigarettes  . Smokeless tobacco: Not on file  . Alcohol Use: No    Allergies:  Allergies  Allergen Reactions  . Sulfa Antibiotics Rash    Prescriptions prior to admission  Medication Sig Dispense Refill  . acetaminophen (TYLENOL) 500 MG tablet Take 1,000 mg by mouth every 4 (four) hours as needed for mild pain or moderate pain.       Marland Kitchen. CALCIUM PO Take 1 tablet by mouth daily.      . Cyanocobalamin (VITAMIN B-12 PO) Take 1 tablet by mouth daily.      . ferrous sulfate (FERROUSUL) 325 (65 FE) MG tablet Take 1 tablet (325 mg total) by mouth 3 (three) times daily with meals.  90 tablet  1  . metoCLOPramide (REGLAN) 10 MG tablet Take 1 tablet (10 mg total) by mouth every 6 (six) hours as needed for nausea.  8 tablet  0  . Multiple Vitamins-Minerals (HAIR/SKIN/NAILS/BIOTIN PO) Take 1 tablet by mouth  daily.      . Prenatal Vit-Fe Fumarate-FA (PRENATAL MULTIVITAMIN) TABS tablet Take 1 tablet by mouth daily at 12 noon.        Review of Systems  Constitutional: Negative.  Negative for fever.  Respiratory: Negative.   Cardiovascular: Negative.  Negative for chest pain.  Gastrointestinal: Positive for abdominal pain. Negative for nausea, vomiting, diarrhea and constipation.  Genitourinary: Negative for dysuria, urgency, frequency, hematuria and flank pain.       Negative for vaginal bleeding, vaginal discharge, dyspareunia  Musculoskeletal: Negative.   Neurological: Negative.   Psychiatric/Behavioral: Negative.    Physical Exam   Blood pressure 127/90, pulse 67, temperature 97.7 F (36.5 C), resp. rate 20, height 5' (1.524 m), weight 173 lb 9.6 oz (78.744 kg), last menstrual period 09/09/2013.  Physical Exam  Nursing note and vitals reviewed. Constitutional: She is oriented to person, place, and time. She appears well-developed and well-nourished. She appears distressed (uncomfortable appearing).  Cardiovascular: Normal rate.   Respiratory: Effort normal.  GI: Soft. She exhibits no distension and no mass. There is tenderness (mild epigastric, bilateral upper quadrants). There is no rebound and no guarding.  Neurological: She is alert and oriented to person, place, and time.  Skin: Skin is warm and dry.  Psychiatric:  She has a normal mood and affect.    MAU Course  Procedures  Results for orders placed during the hospital encounter of 11/11/13 (from the past 24 hour(s))  URINALYSIS, ROUTINE W REFLEX MICROSCOPIC     Status: Abnormal   Collection Time    11/11/13 12:50 AM      Result Value Ref Range   Color, Urine YELLOW  YELLOW   APPearance CLEAR  CLEAR   Specific Gravity, Urine 1.015  1.005 - 1.030   pH 6.5  5.0 - 8.0   Glucose, UA NEGATIVE  NEGATIVE mg/dL   Hgb urine dipstick NEGATIVE  NEGATIVE   Bilirubin Urine NEGATIVE  NEGATIVE   Ketones, ur NEGATIVE  NEGATIVE mg/dL    Protein, ur NEGATIVE  NEGATIVE mg/dL   Urobilinogen, UA 2.0 (*) 0.0 - 1.0 mg/dL   Nitrite NEGATIVE  NEGATIVE   Leukocytes, UA NEGATIVE  NEGATIVE    US Abdomen Complete  11/09/2013   CLINICAL DATA:  Nausea vomiting and abdominal pain ; history of cholecystectomy; currently pregnant  EXAM: ULTRASOUND ABDOMEN COMPLETE  COMPARISON:  None.  FINDINGS: Gallbladder:  The gallbladder is surgically absent. There is no tenderness reported in the right upper quadrant.  Common bile duct:  Diameter: 3.7 mm  Liver:  No focal lesion identified. Within normal limits in parenchymal echogenicity.  IVC:  No abnormality visualized.  Pancreas:  Visualized portion unremarkable.  Spleen:  Size and appearance within normal limits.  Right Kidney:  Length: 13.6 cm. Echogenicity within normal limits. No mass or hydronephrosis visualized.  Left Kidney:  Length: 12.5 cm. Echogenicity within normal limits. No mass or hydronephrosis visualized.  Abdominal aorta:  No aneurysm visualized.  Other findings:  No ascites is demonstrated.  IMPRESSION: Normal abdominal ultrasound examination. The gallbladder is surgically absent.   Electronically Signed   By: David  [place]   On: 11/09/2013 14:34    GI cocktail given in MAU with minimal relief. IV Pepcid and Reglan given (as this provided good relief last night in ED), pt states pain is improved. Percocet 5/325 #1 PO in MAU as well.    Assessment and Plan   1. Abdominal pain in pregnancy, antepartum   Unknown etiology - acid reflux or other GI source most likely. Discussed that if pain continues, she may need GI referral. Should call OB office on Monday if pain is continuing. Rx Reglan, Pepcid, and Percocet 5/325 #10 provided tonight. Advised bland, light diet. Return to general ED w/ unmanageable symptoms.     Medication List         acetaminophen 500 MG tablet  Commonly known as:  TYLENOL  Take 1,000 mg by mouth every 4 (four) hours as needed for mild pain or moderate pain.      CALCIUM PO  Take 1 tablet by mouth daily.     famotidine 20 MG tablet  Commonly known as:  PEPCID  Take 1 tablet (20 mg total) by mouth 2 (two) times daily.     ferrous sulfate 325 (65 FE) MG tablet  Commonly known as:  FERROUSUL  Take 1 tablet (325 mg total) by mouth 3 (three) times daily with meals.     HAIR/SKIN/NAILS/BIOTIN PO  Take 1 tablet by mouth daily.     metoCLOPramide 10 MG tablet  Commonly known as:  REGLAN  Take 1 tablet (10 mg total) by mouth every 6 (six) hours as needed for nausea.     metoCLOPramide 10 MG tablet  Commonly known as:  REGLAN  Take 1 tablet (10 mg total) by mouth 4 (four) times daily.     oxyCODONE-acetaminophen 5-325 MG per tablet  Commonly known as:  PERCOCET/ROXICET  Take 1 tablet by mouth once.     prenatal multivitamin Tabs tablet  Take 1 tablet by mouth daily at 12 noon.     VITAMIN B-12 PO  Take 1 tablet by mouth daily.        Follow-up Information   [facility] with [facility] On 11/12/2013. (If symptoms worsen)    Specialty:  Obstetrics and Gynecology   Contact information:   [address] [place] [id number] [phone]        [facility]FRAZIER,NATALIE 11/11/2013, 3:07 AM

## 2013-11-11 NOTE — Progress Notes (Signed)
Georges MouseNatalie Frazier CNM in earlier to discuss d/c plan. Written and verbal d/c instructions given and understanding voiced.

## 2013-11-21 ENCOUNTER — Other Ambulatory Visit (HOSPITAL_COMMUNITY)
Admission: RE | Admit: 2013-11-21 | Discharge: 2013-11-21 | Disposition: A | Discharge status: Home / Self Care | Payer: Medicaid Other | Source: Ambulatory Visit | Attending: Advanced Practice Midwife | Admitting: Advanced Practice Midwife

## 2013-11-21 ENCOUNTER — Ambulatory Visit (INDEPENDENT_AMBULATORY_CARE_PROVIDER_SITE_OTHER): Payer: Medicaid Other | Admitting: Advanced Practice Midwife

## 2013-11-21 ENCOUNTER — Encounter: Payer: Self-pay | Admitting: Advanced Practice Midwife

## 2013-11-21 VITALS — BP 118/77 | HR 68 | Temp 97.0°F | Wt 174.4 lb

## 2013-11-21 DIAGNOSIS — O99211 Obesity complicating pregnancy, first trimester: Secondary | ICD-10-CM

## 2013-11-21 DIAGNOSIS — R87810 Cervical high risk human papillomavirus (HPV) DNA test positive: Secondary | ICD-10-CM | POA: Insufficient documentation

## 2013-11-21 DIAGNOSIS — E669 Obesity, unspecified: Secondary | ICD-10-CM

## 2013-11-21 DIAGNOSIS — Z3491 Encounter for supervision of normal pregnancy, unspecified, first trimester: Secondary | ICD-10-CM

## 2013-11-21 DIAGNOSIS — Z113 Encounter for screening for infections with a predominantly sexual mode of transmission: Secondary | ICD-10-CM | POA: Insufficient documentation

## 2013-11-21 DIAGNOSIS — D649 Anemia, unspecified: Secondary | ICD-10-CM

## 2013-11-21 DIAGNOSIS — Z1151 Encounter for screening for human papillomavirus (HPV): Secondary | ICD-10-CM | POA: Diagnosis present

## 2013-11-21 DIAGNOSIS — Z9884 Bariatric surgery status: Secondary | ICD-10-CM

## 2013-11-21 DIAGNOSIS — O3680X0 Pregnancy with inconclusive fetal viability, not applicable or unspecified: Secondary | ICD-10-CM

## 2013-11-21 DIAGNOSIS — O99210 Obesity complicating pregnancy, unspecified trimester: Secondary | ICD-10-CM

## 2013-11-21 DIAGNOSIS — Z3480 Encounter for supervision of other normal pregnancy, unspecified trimester: Secondary | ICD-10-CM

## 2013-11-21 DIAGNOSIS — O99019 Anemia complicating pregnancy, unspecified trimester: Secondary | ICD-10-CM

## 2013-11-21 DIAGNOSIS — Z124 Encounter for screening for malignant neoplasm of cervix: Secondary | ICD-10-CM | POA: Insufficient documentation

## 2013-11-21 LAB — OB RESULTS CONSOLE GC/CHLAMYDIA
Chlamydia: NEGATIVE
GC PROBE AMP, GENITAL: NEGATIVE

## 2013-11-21 LAB — POCT URINALYSIS DIP (DEVICE)
BILIRUBIN URINE: NEGATIVE
GLUCOSE, UA: NEGATIVE mg/dL
Hgb urine dipstick: NEGATIVE
Ketones, ur: NEGATIVE mg/dL
Leukocytes, UA: NEGATIVE
Nitrite: NEGATIVE
Protein, ur: NEGATIVE mg/dL
SPECIFIC GRAVITY, URINE: 1.015 (ref 1.005–1.030)
Urobilinogen, UA: 0.2 mg/dL (ref 0.0–1.0)
pH: 7 (ref 5.0–8.0)

## 2013-11-21 LAB — US OB COMP LESS 14 WKS

## 2013-11-21 NOTE — Progress Notes (Signed)
Subjective:    Barbara Burton is a [id number] [redacted]w[redacted]d  By LMP (verified by [place] today) being seen today for her first obstetrical visit.  Her obstetrical history is significant for obesity and C/S x 4, Hx gastric bypass. Patient does intend to breast feed. Pregnancy history fully reviewe  Patient reports no bleeding and no cramping.  Filed Vitals:   11/21/13 1010  BP: 118/77  Pulse: 68  Temp: 97 F (36.1 C)  Weight: 174 lb 6.4 oz (79.107 kg)    HISTORY: OB History  Gravida Para Term Preterm AB SAB TAB Ectopic Multiple Living  5 4 4       4     # Outcome Date GA Lbr Len/2nd Weight Sex Delivery Anes PTL Lv  5 CUR           4 TRM 03/11/06 [redacted]w[redacted]d  6 lb 12 oz (3.062 kg) M CS        Comments: HTN   3 TRM 03/10/04 [redacted]w[redacted]d  6 lb 15 oz (3.147 kg) M CS        Comments: HTN   2 TRM 09/27/01 [redacted]w[redacted]d  7 lb 5 oz (3.317 kg) M CS     1 TRM 12/03/98 [redacted]w[redacted]d  8 lb 1 oz (3.657 kg) F CS        Comments: No complications      Past Medical History  Diagnosis Date  . Medical history non-contributory    Past Surgical History  Procedure Laterality Date  . Gastric bypass    . Cesarean section    . Gallbladder surgery     Family History  Problem Relation Age of Onset  . Diabetes Mother   . Hypertension Father   . Heart disease Maternal Grandmother      Exam    Uterus:   UTA due to body habitus and scar tissue.  Pelvic Exam:    Perineum: Normal Perineum   Vulva: normal   Vagina:  normal mucosa, normal discharge   pH: NA   Cervix: no bleeding following Pap, no cervical motion tenderness, no lesions and nulliparous appearance   Adnexa: normal adnexa and no mass, fullness, tenderness   Bony Pelvis: average  System: Breast:  normal appearance, no masses or tenderness   Skin: normal coloration and turgor, no rashes    Neurologic: oriented, normal, normal mood   Extremities: No Edema. Neg Homan's   HEENT sclera clear, anicteric   Mouth/Teeth mucous membranes moist, pharynx normal without  lesions and dental hygiene good   Neck no masses   Cardiovascular: regular rate and rhythm, no murmurs or gallops   Respiratory:  appears well, vitals normal, no respiratory distress, acyanotic, normal RR, chest clear, no wheezing, crepitations, rhonchi, normal symmetric air entry   Abdomen: soft, non-tender; bowel sounds normal; no masses,  no organomegaly and LTCS scar well healed.    Urinary: urethral meatus normal      Assessment:    Pregnancy: [id number] Patient Active Problem List   Diagnosis Date Noted  . Anemia affecting pregnancy, antepartum 10/19/2013        Plan:     Initial labs drawn. Prenatal vitamins. Problem list reviewed and updated. Genetic Screening discussed First Screen: ordered.  Ultrasound discussed; fetal survey: requested.  Follow up in 4 weeks. 50% of 30 min visit spent on counseling and coordination of care.  Will consult attending RE: addition testing/monnitoring of pt due to Hx gastric bipass Continue iron Needs early GTT, but doesn't think she can  tolerate glucola or jelly beans it due to dumping syndrome. May check sugars and bring log.    [name], Maral Lampe 11/21/2013

## 2013-11-21 NOTE — Progress Notes (Signed)
Bedside US for viability = single IUP; cardiac activity - present, 175 bpm per PW doppler.  Dorathy KinsmanVirginia Brame CNM notified

## 2013-11-21 NOTE — Progress Notes (Signed)
Patient up to date on lab work-- in need of early 1hr gtt, however, had gastric bypass and unable to have sugar-- defer to provider.  Pap today.  New OB packet given. Discussed appropriate weight gain (11-20lb); pt. Verbalized understanding.

## 2013-11-21 NOTE — Patient Instructions (Signed)

## 2013-11-22 LAB — CYTOLOGY - PAP

## 2013-11-28 DIAGNOSIS — O99212 Obesity complicating pregnancy, second trimester: Secondary | ICD-10-CM | POA: Insufficient documentation

## 2013-11-28 DIAGNOSIS — Z9884 Bariatric surgery status: Secondary | ICD-10-CM | POA: Insufficient documentation

## 2013-11-28 DIAGNOSIS — O0990 Supervision of high risk pregnancy, unspecified, unspecified trimester: Secondary | ICD-10-CM | POA: Insufficient documentation

## 2013-11-28 MED ORDER — ACCU-CHEK FASTCLIX LANCETS MISC: 1.0000 | Freq: Four times a day (QID) | Status: DC

## 2013-11-28 MED ORDER — ACCU-CHEK NANO SMARTVIEW W/DEVICE KIT: 1.0000 | PACK | Status: DC | PRN

## 2013-11-28 MED ORDER — GLUCOSE BLOOD VI STRP: ORAL_STRIP | Status: DC

## 2013-11-29 ENCOUNTER — Encounter: Payer: Self-pay | Admitting: Advanced Practice Midwife

## 2013-11-29 ENCOUNTER — Telehealth: Payer: Self-pay

## 2013-11-29 DIAGNOSIS — R87811 Vaginal high risk human papillomavirus (HPV) DNA test positive: Secondary | ICD-10-CM

## 2013-11-29 DIAGNOSIS — R87620 Atypical squamous cells of undetermined significance on cytologic smear of vagina (ASC-US): Secondary | ICD-10-CM | POA: Insufficient documentation

## 2013-11-29 NOTE — Telephone Encounter (Signed)
Message copied by Louanna RawAMPBELL, Timiko Offutt M on Thu Nov 29, 2013  4:44 PM ------      Message from: [place]      Created: Thu Nov 29, 2013  4:24 PM       Needs blood sugar testing teaching done. 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 Prefers blood sugar testing instead. Supplies Rx'd.        ------

## 2013-11-29 NOTE — Telephone Encounter (Signed)
Attempted to call patient. No answer. Left message stating we are calling to inform you of results as well as inform you of an appointment for blood glucose testing education for this Monday 12/03/13 at 0900 as you cannot do glucose test that you discussed with the provider, please call clinic. Patient needs to be informed of pap results as well and the need for a colpo.

## 2013-11-29 NOTE — Telephone Encounter (Signed)
Message copied by Louanna RawAMPBELL, Jedaiah Rathbun M on Thu Nov 29, 2013  4:44 PM ------      Message from: [place]      Created: Thu Nov 29, 2013  4:24 PM       Pap ASCUS pos HRHPV. Needs Colpo. ------

## 2013-11-30 NOTE — Telephone Encounter (Signed)
Patient returned call stating she cannot make the appointment on Monday because it's too short notice for her job and we can also leave results on her voicemail at [phone] or we can contact her at work at [phone]. Called patient back and asked if she could come in for the blood sugar teaching on 8/3. Patient stated that she could but she needed the earliest possible appointment. Told patient she may arrive at 8:30. Patient verbalized understanding. Informed patient of abnormal pap and recommendation for colposcopy that will be done at her next visit and explained procedure. Patient verbalized understanding. Told patient if she thinks of any questions she has later on she may call us back then. Patient verbalized understanding and had no other questions

## 2013-12-05 ENCOUNTER — Ambulatory Visit (HOSPITAL_COMMUNITY)
Admission: RE | Admit: 2013-12-05 | Discharge: 2013-12-05 | Disposition: A | Discharge status: Home / Self Care | Payer: Medicaid Other | Source: Ambulatory Visit | Attending: Physician Assistant | Admitting: Physician Assistant

## 2013-12-05 ENCOUNTER — Ambulatory Visit (HOSPITAL_COMMUNITY)
Admission: RE | Admit: 2013-12-05 | Discharge: 2013-12-05 | Disposition: A | Discharge status: Home / Self Care | Payer: Medicaid Other | Source: Ambulatory Visit | Attending: Advanced Practice Midwife | Admitting: Advanced Practice Midwife

## 2013-12-05 ENCOUNTER — Other Ambulatory Visit: Payer: Self-pay

## 2013-12-05 ENCOUNTER — Encounter (HOSPITAL_COMMUNITY): Payer: Self-pay

## 2013-12-05 VITALS — BP 121/72 | HR 56 | Wt 180.2 lb

## 2013-12-05 DIAGNOSIS — Z36 Encounter for antenatal screening of mother: Secondary | ICD-10-CM | POA: Insufficient documentation

## 2013-12-05 DIAGNOSIS — Z3491 Encounter for supervision of normal pregnancy, unspecified, first trimester: Secondary | ICD-10-CM

## 2013-12-07 ENCOUNTER — Encounter: Payer: Self-pay | Admitting: Advanced Practice Midwife

## 2013-12-10 ENCOUNTER — Encounter: Payer: Medicaid Other | Attending: Advanced Practice Midwife | Admitting: *Deleted

## 2013-12-10 ENCOUNTER — Ambulatory Visit (INDEPENDENT_AMBULATORY_CARE_PROVIDER_SITE_OTHER): Payer: Medicaid Other | Admitting: Obstetrics & Gynecology

## 2013-12-10 DIAGNOSIS — O99810 Abnormal glucose complicating pregnancy: Secondary | ICD-10-CM | POA: Insufficient documentation

## 2013-12-10 DIAGNOSIS — Z713 Dietary counseling and surveillance: Secondary | ICD-10-CM | POA: Insufficient documentation

## 2013-12-10 MED ORDER — ACCU-CHEK FASTCLIX LANCETS MISC: 1.0000 | Freq: Four times a day (QID) | Status: DC

## 2013-12-10 MED ORDER — GLUCOSE BLOOD VI STRP: ORAL_STRIP | Status: DC

## 2013-12-10 NOTE — Progress Notes (Signed)
  Patient was seen on 12/10/13 for instruction in glucose monitoring. Barbara Burton is unable to have the 3hgtt performed as she is post gastric bypass and unable to consume to prep. Her mother is T2DM. We will instruct in glucose monitoring and monitor throughout her pregnancy.   States when to check blood glucose levels  Demonstrates proper blood glucose monitoring techniques  States the effect of stress and exercise on blood glucose levels  Plan:  Consider  increasing your activity level by walking daily as tolerated Begin checking BG before breakfast and 2 hours after first bit of breakfast, lunch and dinner after  as directed by MD   Blood glucose monitor given: Accu Chek Nano BG Monitoring Kit Lot # see log book Exp: see log book  Patient instructed to monitor glucose levels: FBS: 60 - <90 2 hour: <120  Patient will be seen for follow-up as needed.

## 2013-12-11 ENCOUNTER — Other Ambulatory Visit: Payer: Self-pay

## 2013-12-11 DIAGNOSIS — O99810 Abnormal glucose complicating pregnancy: Secondary | ICD-10-CM

## 2013-12-11 MED ORDER — GLUCOSE BLOOD VI STRP: ORAL_STRIP | Status: DC

## 2013-12-11 NOTE — Progress Notes (Signed)
Patient called stating a RX was sent in for her lancets but not test strips. Per chart review, test strips were printed and not e-prescribed. Called [facility] and spoke to pharmacist who states accu chek active strips will not fit in nano-- accu chek smart view strips will--- smartstrips ordered 30 day supply with 12 refills. Called patient. No answer. Left message stating strips have been sent to your pharmacy, call clinic with questions or concerns.

## 2013-12-17 ENCOUNTER — Telehealth (HOSPITAL_COMMUNITY): Payer: Self-pay | Admitting: MS"

## 2013-12-17 NOTE — Telephone Encounter (Signed)
Left message for patient to return call.   Barbara Burton 12/17/2013 4:15 PM

## 2013-12-18 NOTE — Telephone Encounter (Signed)
Called Barbara Burton to discuss her cell free fetal DNA test results.  Ms. Barbara Burton had [facility] testing through [facility].  Testing was offered because of maternal age.   The patient was identified by name and DOB.  We reviewed that these are within normal limits, showing a less than 1 in 10,000 risk for trisomies 21, 18 and 13, and monosomy X (Turner syndrome).  In addition, the risk for triploidy/vanishing twin and sex chromosome trisomies (47,XXX and 47,XXY) was also low risk.  We reviewed that this testing identifies > 99% of pregnancies with trisomy 6921, trisomy 6813, sex chromosome trisomies (47,XXX and 47,XXY), and triploidy. The detection rate for trisomy 18 is 96%.  The detection rate for monosomy X is ~92%.  The false positive rate is <0.1% for all conditions. Testing was also consistent with female fetal sex.  The patient did wish to know fetal sex.  She understands that this testing does not identify all genetic conditions.  All questions were answered to her satisfaction, she was encouraged to call with additional questions or concerns.  Barbara PlowmanKaren Latoyia Tecson, MS Certified Genetic Counselor 12/18/2013 11:13 AM

## 2013-12-21 ENCOUNTER — Ambulatory Visit (INDEPENDENT_AMBULATORY_CARE_PROVIDER_SITE_OTHER): Payer: Medicaid Other | Admitting: Physician Assistant

## 2013-12-21 VITALS — BP 118/60 | HR 70 | Temp 97.6°F | Wt 182.9 lb

## 2013-12-21 DIAGNOSIS — Z3491 Encounter for supervision of normal pregnancy, unspecified, first trimester: Secondary | ICD-10-CM

## 2013-12-21 DIAGNOSIS — Z3480 Encounter for supervision of other normal pregnancy, unspecified trimester: Secondary | ICD-10-CM

## 2013-12-21 DIAGNOSIS — R87811 Vaginal high risk human papillomavirus (HPV) DNA test positive: Secondary | ICD-10-CM

## 2013-12-21 DIAGNOSIS — R87620 Atypical squamous cells of undetermined significance on cytologic smear of vagina (ASC-US): Secondary | ICD-10-CM

## 2013-12-21 LAB — POCT URINALYSIS DIP (DEVICE)
Bilirubin Urine: NEGATIVE
GLUCOSE, UA: 500 mg/dL — AB
Hgb urine dipstick: NEGATIVE
KETONES UR: NEGATIVE mg/dL
LEUKOCYTES UA: NEGATIVE
Nitrite: NEGATIVE
Protein, ur: NEGATIVE mg/dL
SPECIFIC GRAVITY, URINE: 1.02 (ref 1.005–1.030)
Urobilinogen, UA: 0.2 mg/dL (ref 0.0–1.0)
pH: 5.5 (ref 5.0–8.0)

## 2013-12-21 NOTE — Progress Notes (Signed)
14 week IUP.  She admits to having Frosted Flakes for breakfast this am immediately before appt.  She reports she is checking her blood sugars and working with diabetes educator on this. She was scheduled to have colpo today for ASCUS with HRHPV.  Dr. Debroah LoopArnold was consulted and plans for pt to defer colpo until 6 weeks post partum.   F/U Ultrasound scheduled for 9/9.  RTC 4 weeks.  Call if concerns.

## 2014-01-11 ENCOUNTER — Other Ambulatory Visit: Payer: Self-pay | Admitting: Family Medicine

## 2014-01-11 DIAGNOSIS — O09529 Supervision of elderly multigravida, unspecified trimester: Secondary | ICD-10-CM

## 2014-01-16 ENCOUNTER — Encounter (HOSPITAL_COMMUNITY): Payer: Self-pay

## 2014-01-16 ENCOUNTER — Ambulatory Visit (HOSPITAL_COMMUNITY)
Admission: RE | Admit: 2014-01-16 | Discharge: 2014-01-16 | Disposition: A | Discharge status: Home / Self Care | Payer: Medicaid Other | Source: Ambulatory Visit | Attending: Advanced Practice Midwife | Admitting: Advanced Practice Midwife

## 2014-01-16 VITALS — BP 137/79 | HR 78 | Wt 186.0 lb

## 2014-01-16 DIAGNOSIS — E669 Obesity, unspecified: Secondary | ICD-10-CM | POA: Insufficient documentation

## 2014-01-16 DIAGNOSIS — Z9884 Bariatric surgery status: Secondary | ICD-10-CM | POA: Insufficient documentation

## 2014-01-16 DIAGNOSIS — O09529 Supervision of elderly multigravida, unspecified trimester: Secondary | ICD-10-CM

## 2014-01-16 DIAGNOSIS — O3490 Maternal care for abnormality of pelvic organ, unspecified, unspecified trimester: Secondary | ICD-10-CM | POA: Insufficient documentation

## 2014-01-16 DIAGNOSIS — IMO0002 Reserved for concepts with insufficient information to code with codable children: Secondary | ICD-10-CM

## 2014-01-16 DIAGNOSIS — O09299 Supervision of pregnancy with other poor reproductive or obstetric history, unspecified trimester: Secondary | ICD-10-CM | POA: Diagnosis not present

## 2014-01-16 DIAGNOSIS — O99210 Obesity complicating pregnancy, unspecified trimester: Secondary | ICD-10-CM

## 2014-01-16 DIAGNOSIS — O99211 Obesity complicating pregnancy, first trimester: Secondary | ICD-10-CM

## 2014-01-18 ENCOUNTER — Ambulatory Visit (INDEPENDENT_AMBULATORY_CARE_PROVIDER_SITE_OTHER): Payer: Medicaid Other | Admitting: Family Medicine

## 2014-01-18 VITALS — BP 122/65 | HR 70 | Temp 97.9°F | Wt 185.8 lb

## 2014-01-18 DIAGNOSIS — Z3491 Encounter for supervision of normal pregnancy, unspecified, first trimester: Secondary | ICD-10-CM

## 2014-01-18 DIAGNOSIS — Z3480 Encounter for supervision of other normal pregnancy, unspecified trimester: Secondary | ICD-10-CM

## 2014-01-18 LAB — POCT URINALYSIS DIP (DEVICE)
BILIRUBIN URINE: NEGATIVE
Glucose, UA: NEGATIVE mg/dL
HGB URINE DIPSTICK: NEGATIVE
Ketones, ur: NEGATIVE mg/dL
Leukocytes, UA: NEGATIVE
Nitrite: NEGATIVE
Protein, ur: NEGATIVE mg/dL
Specific Gravity, Urine: 1.01 (ref 1.005–1.030)
Urobilinogen, UA: 0.2 mg/dL (ref 0.0–1.0)
pH: 5.5 (ref 5.0–8.0)

## 2014-01-18 NOTE — Patient Instructions (Signed)
Second Trimester of Pregnancy The second trimester is from week 13 through week 28, months 4 through 6. The second trimester is often a time when you feel your best. Your body has also adjusted to being pregnant, and you begin to feel better physically. Usually, morning sickness has lessened or quit completely, you may have more energy, and you may have an increase in appetite. The second trimester is also a time when the fetus is growing rapidly. At the end of the sixth month, the fetus is about 9 inches long and weighs about 1 pounds. You will likely begin to feel the baby move (quickening) between 18 and 20 weeks of the pregnancy. BODY CHANGES Your body goes through many changes during pregnancy. The changes vary from woman to woman.   Your weight will continue to increase. You will notice your lower abdomen bulging out.  You may begin to get stretch marks on your hips, abdomen, and breasts.  You may develop headaches that can be relieved by medicines approved by your health care provider.  You may urinate more often because the fetus is pressing on your bladder.  You may develop or continue to have heartburn as a result of your pregnancy.  You may develop constipation because certain hormones are causing the muscles that push waste through your intestines to slow down.  You may develop hemorrhoids or swollen, bulging veins (varicose veins).  You may have back pain because of the weight gain and pregnancy hormones relaxing your joints between the bones in your pelvis and as a result of a shift in weight and the muscles that support your balance.  Your breasts will continue to grow and be tender.  Your gums may bleed and may be sensitive to brushing and flossing.  Dark spots or blotches (chloasma, mask of pregnancy) may develop on your face. This will likely fade after the baby is born.  A dark line from your belly button to the pubic area (linea nigra) may appear. This will likely fade  after the baby is born.  You may have changes in your hair. These can include thickening of your hair, rapid growth, and changes in texture. Some women also have hair loss during or after pregnancy, or hair that feels dry or thin. Your hair will most likely return to normal after your baby is born. WHAT TO EXPECT AT YOUR PRENATAL VISITS During a routine prenatal visit:  You will be weighed to make sure you and the fetus are growing normally.  Your blood pressure will be taken.  Your abdomen will be measured to track your baby's growth.  The fetal heartbeat will be listened to.  Any test results from the previous visit will be discussed. Your health care provider may ask you:  How you are feeling.  If you are feeling the baby move.  If you have had any abnormal symptoms, such as leaking fluid, bleeding, severe headaches, or abdominal cramping.  If you have any questions. Other tests that may be performed during your second trimester include:  Blood tests that check for:  Low iron levels (anemia).  Gestational diabetes (between 24 and 28 weeks).  Rh antibodies.  Urine tests to check for infections, diabetes, or protein in the urine.  An ultrasound to confirm the proper growth and development of the baby.  An amniocentesis to check for possible genetic problems.  Fetal screens for spina bifida and Down syndrome. HOME CARE INSTRUCTIONS   Avoid all smoking, herbs, alcohol, and unprescribed   drugs. These chemicals affect the formation and growth of the baby.  Follow your health care provider's instructions regarding medicine use. There are medicines that are either safe or unsafe to take during pregnancy.  Exercise only as directed by your health care provider. Experiencing uterine cramps is a good sign to stop exercising.  Continue to eat regular, healthy meals.  Wear a good support bra for breast tenderness.  Do not use hot tubs, steam rooms, or saunas.  Wear your  seat belt at all times when driving.  Avoid raw meat, uncooked cheese, cat litter boxes, and soil used by cats. These carry germs that can cause birth defects in the baby.  Take your prenatal vitamins.  Try taking a stool softener (if your health care provider approves) if you develop constipation. Eat more high-fiber foods, such as fresh vegetables or fruit and whole grains. Drink plenty of fluids to keep your urine clear or pale yellow.  Take warm sitz baths to soothe any pain or discomfort caused by hemorrhoids. Use hemorrhoid cream if your health care provider approves.  If you develop varicose veins, wear support hose. Elevate your feet for 15 minutes, 3-4 times a day. Limit salt in your diet.  Avoid heavy lifting, wear low heel shoes, and practice good posture.  Rest with your legs elevated if you have leg cramps or low back pain.  Visit your dentist if you have not gone yet during your pregnancy. Use a soft toothbrush to brush your teeth and be gentle when you floss.  A sexual relationship may be continued unless your health care provider directs you otherwise.  Continue to go to all your prenatal visits as directed by your health care provider. SEEK MEDICAL CARE IF:   You have dizziness.  You have mild pelvic cramps, pelvic pressure, or nagging pain in the abdominal area.  You have persistent nausea, vomiting, or diarrhea.  You have a bad smelling vaginal discharge.  You have pain with urination. SEEK IMMEDIATE MEDICAL CARE IF:   You have a fever.  You are leaking fluid from your vagina.  You have spotting or bleeding from your vagina.  You have severe abdominal cramping or pain.  You have rapid weight gain or loss.  You have shortness of breath with chest pain.  You notice sudden or extreme swelling of your face, hands, ankles, feet, or legs.  You have not felt your baby move in over an hour.  You have severe headaches that do not go away with  medicine.  You have vision changes. Document Released: 04/20/2001 Document Revised: 05/01/2013 Document Reviewed: 06/27/2012 ExitCare Patient Information 2015 [employer]. This information is not intended to replace advice given to you by your health care provider. Make sure you discuss any questions you have with your health care provider.  

## 2014-01-18 NOTE — Progress Notes (Signed)
Discussed flu vaccine-- patient declines.

## 2014-01-18 NOTE — Progress Notes (Signed)
CBGs controlled. Mild movements felt.  Denies contractions, leaking fluid, bleeding.  F/u in 4 weeks.

## 2014-02-14 ENCOUNTER — Ambulatory Visit (HOSPITAL_COMMUNITY)
Admission: RE | Admit: 2014-02-14 | Discharge: 2014-02-14 | Disposition: A | Discharge status: Home / Self Care | Payer: Medicaid Other | Source: Ambulatory Visit | Attending: Obstetrics & Gynecology | Admitting: Obstetrics & Gynecology

## 2014-02-14 ENCOUNTER — Ambulatory Visit (INDEPENDENT_AMBULATORY_CARE_PROVIDER_SITE_OTHER): Payer: Medicaid Other | Admitting: Obstetrics & Gynecology

## 2014-02-14 ENCOUNTER — Encounter: Payer: Self-pay | Admitting: Advanced Practice Midwife

## 2014-02-14 VITALS — BP 130/71 | HR 83 | Temp 97.8°F | Wt 193.2 lb

## 2014-02-14 VITALS — BP 112/57 | HR 63 | Wt 193.8 lb

## 2014-02-14 DIAGNOSIS — E669 Obesity, unspecified: Secondary | ICD-10-CM

## 2014-02-14 DIAGNOSIS — Z9884 Bariatric surgery status: Secondary | ICD-10-CM

## 2014-02-14 DIAGNOSIS — O09529 Supervision of elderly multigravida, unspecified trimester: Secondary | ICD-10-CM | POA: Diagnosis not present

## 2014-02-14 DIAGNOSIS — O99840 Bariatric surgery status complicating pregnancy, unspecified trimester: Secondary | ICD-10-CM | POA: Insufficient documentation

## 2014-02-14 DIAGNOSIS — O09522 Supervision of elderly multigravida, second trimester: Secondary | ICD-10-CM

## 2014-02-14 DIAGNOSIS — O99211 Obesity complicating pregnancy, first trimester: Secondary | ICD-10-CM

## 2014-02-14 DIAGNOSIS — Z3491 Encounter for supervision of normal pregnancy, unspecified, first trimester: Secondary | ICD-10-CM

## 2014-02-14 DIAGNOSIS — Z3481 Encounter for supervision of other normal pregnancy, first trimester: Secondary | ICD-10-CM

## 2014-02-14 LAB — POCT URINALYSIS DIP (DEVICE)
BILIRUBIN URINE: NEGATIVE
GLUCOSE, UA: 100 mg/dL — AB
Hgb urine dipstick: NEGATIVE
KETONES UR: NEGATIVE mg/dL
Leukocytes, UA: NEGATIVE
NITRITE: NEGATIVE
PH: 6 (ref 5.0–8.0)
Protein, ur: NEGATIVE mg/dL
Specific Gravity, Urine: 1.025 (ref 1.005–1.030)
Urobilinogen, UA: 1 mg/dL (ref 0.0–1.0)

## 2014-02-14 NOTE — Progress Notes (Signed)
Has had nl BG testing QID in order to avoid 1 hr glucose load, but she says she could do the jelly bean load instead, which we will schedule and she can stop home testing.

## 2014-02-14 NOTE — Progress Notes (Signed)
No complaints today.

## 2014-02-14 NOTE — Patient Instructions (Signed)
Second Trimester of Pregnancy The second trimester is from week 13 through week 28, months 4 through 6. The second trimester is often a time when you feel your best. Your body has also adjusted to being pregnant, and you begin to feel better physically. Usually, morning sickness has lessened or quit completely, you may have more energy, and you may have an increase in appetite. The second trimester is also a time when the fetus is growing rapidly. At the end of the sixth month, the fetus is about 9 inches long and weighs about 1 pounds. You will likely begin to feel the baby move (quickening) between 18 and 20 weeks of the pregnancy. BODY CHANGES Your body goes through many changes during pregnancy. The changes vary from woman to woman.   Your weight will continue to increase. You will notice your lower abdomen bulging out.  You may begin to get stretch marks on your hips, abdomen, and breasts.  You may develop headaches that can be relieved by medicines approved by your health care provider.  You may urinate more often because the fetus is pressing on your bladder.  You may develop or continue to have heartburn as a result of your pregnancy.  You may develop constipation because certain hormones are causing the muscles that push waste through your intestines to slow down.  You may develop hemorrhoids or swollen, bulging veins (varicose veins).  You may have back pain because of the weight gain and pregnancy hormones relaxing your joints between the bones in your pelvis and as a result of a shift in weight and the muscles that support your balance.  Your breasts will continue to grow and be tender.  Your gums may bleed and may be sensitive to brushing and flossing.  Dark spots or blotches (chloasma, mask of pregnancy) may develop on your face. This will likely fade after the baby is born.  A dark line from your belly button to the pubic area (linea nigra) may appear. This will likely fade  after the baby is born.  You may have changes in your hair. These can include thickening of your hair, rapid growth, and changes in texture. Some women also have hair loss during or after pregnancy, or hair that feels dry or thin. Your hair will most likely return to normal after your baby is born. WHAT TO EXPECT AT YOUR PRENATAL VISITS During a routine prenatal visit:  You will be weighed to make sure you and the fetus are growing normally.  Your blood pressure will be taken.  Your abdomen will be measured to track your baby's growth.  The fetal heartbeat will be listened to.  Any test results from the previous visit will be discussed. Your health care provider may ask you:  How you are feeling.  If you are feeling the baby move.  If you have had any abnormal symptoms, such as leaking fluid, bleeding, severe headaches, or abdominal cramping.  If you have any questions. Other tests that may be performed during your second trimester include:  Blood tests that check for:  Low iron levels (anemia).  Gestational diabetes (between 24 and 28 weeks).  Rh antibodies.  Urine tests to check for infections, diabetes, or protein in the urine.  An ultrasound to confirm the proper growth and development of the baby.  An amniocentesis to check for possible genetic problems.  Fetal screens for spina bifida and Down syndrome. HOME CARE INSTRUCTIONS   Avoid all smoking, herbs, alcohol, and unprescribed   drugs. These chemicals affect the formation and growth of the baby.  Follow your health care provider's instructions regarding medicine use. There are medicines that are either safe or unsafe to take during pregnancy.  Exercise only as directed by your health care provider. Experiencing uterine cramps is a good sign to stop exercising.  Continue to eat regular, healthy meals.  Wear a good support bra for breast tenderness.  Do not use hot tubs, steam rooms, or saunas.  Wear your  seat belt at all times when driving.  Avoid raw meat, uncooked cheese, cat litter boxes, and soil used by cats. These carry germs that can cause birth defects in the baby.  Take your prenatal vitamins.  Try taking a stool softener (if your health care provider approves) if you develop constipation. Eat more high-fiber foods, such as fresh vegetables or fruit and whole grains. Drink plenty of fluids to keep your urine clear or pale yellow.  Take warm sitz baths to soothe any pain or discomfort caused by hemorrhoids. Use hemorrhoid cream if your health care provider approves.  If you develop varicose veins, wear support hose. Elevate your feet for 15 minutes, 3-4 times a day. Limit salt in your diet.  Avoid heavy lifting, wear low heel shoes, and practice good posture.  Rest with your legs elevated if you have leg cramps or low back pain.  Visit your dentist if you have not gone yet during your pregnancy. Use a soft toothbrush to brush your teeth and be gentle when you floss.  A sexual relationship may be continued unless your health care provider directs you otherwise.  Continue to go to all your prenatal visits as directed by your health care provider. SEEK MEDICAL CARE IF:   You have dizziness.  You have mild pelvic cramps, pelvic pressure, or nagging pain in the abdominal area.  You have persistent nausea, vomiting, or diarrhea.  You have a bad smelling vaginal discharge.  You have pain with urination. SEEK IMMEDIATE MEDICAL CARE IF:   You have a fever.  You are leaking fluid from your vagina.  You have spotting or bleeding from your vagina.  You have severe abdominal cramping or pain.  You have rapid weight gain or loss.  You have shortness of breath with chest pain.  You notice sudden or extreme swelling of your face, hands, ankles, feet, or legs.  You have not felt your baby move in over an hour.  You have severe headaches that do not go away with  medicine.  You have vision changes. Document Released: 04/20/2001 Document Revised: 05/01/2013 Document Reviewed: 06/27/2012 ExitCare Patient Information 2015 [employer]. This information is not intended to replace advice given to you by your health care provider. Make sure you discuss any questions you have with your health care provider.  

## 2014-02-20 ENCOUNTER — Encounter: Payer: Self-pay | Admitting: *Deleted

## 2014-03-07 ENCOUNTER — Other Ambulatory Visit (HOSPITAL_COMMUNITY): Payer: Self-pay | Admitting: Obstetrics and Gynecology

## 2014-03-07 DIAGNOSIS — O09292 Supervision of pregnancy with other poor reproductive or obstetric history, second trimester: Secondary | ICD-10-CM

## 2014-03-07 DIAGNOSIS — O09522 Supervision of elderly multigravida, second trimester: Secondary | ICD-10-CM

## 2014-03-07 DIAGNOSIS — O34219 Maternal care for unspecified type scar from previous cesarean delivery: Secondary | ICD-10-CM

## 2014-03-07 DIAGNOSIS — Z8632 Personal history of gestational diabetes: Secondary | ICD-10-CM

## 2014-03-07 DIAGNOSIS — Z9884 Bariatric surgery status: Secondary | ICD-10-CM

## 2014-03-07 DIAGNOSIS — Z3689 Encounter for other specified antenatal screening: Secondary | ICD-10-CM

## 2014-03-11 ENCOUNTER — Encounter (HOSPITAL_COMMUNITY): Payer: Self-pay

## 2014-03-14 ENCOUNTER — Ambulatory Visit (INDEPENDENT_AMBULATORY_CARE_PROVIDER_SITE_OTHER): Payer: Medicaid Other | Admitting: Family Medicine

## 2014-03-14 VITALS — BP 120/60 | HR 75 | Wt 194.4 lb

## 2014-03-14 DIAGNOSIS — O0992 Supervision of high risk pregnancy, unspecified, second trimester: Secondary | ICD-10-CM

## 2014-03-14 DIAGNOSIS — Z9884 Bariatric surgery status: Secondary | ICD-10-CM

## 2014-03-14 LAB — POCT URINALYSIS DIP (DEVICE)
Bilirubin Urine: NEGATIVE
GLUCOSE, UA: NEGATIVE mg/dL
Hgb urine dipstick: NEGATIVE
Ketones, ur: NEGATIVE mg/dL
Leukocytes, UA: NEGATIVE
NITRITE: NEGATIVE
PROTEIN: NEGATIVE mg/dL
SPECIFIC GRAVITY, URINE: 1.01 (ref 1.005–1.030)
Urobilinogen, UA: 0.2 mg/dL (ref 0.0–1.0)
pH: 7 (ref 5.0–8.0)

## 2014-03-14 LAB — RPR

## 2014-03-14 LAB — CBC
HCT: 32.2 % — ABNORMAL LOW (ref 36.0–46.0)
Hemoglobin: 10.1 g/dL — ABNORMAL LOW (ref 12.0–15.0)
MCH: 21.6 pg — ABNORMAL LOW (ref 26.0–34.0)
MCHC: 31.4 g/dL (ref 30.0–36.0)
MCV: 68.8 fL — ABNORMAL LOW (ref 78.0–100.0)
PLATELETS: 265 10*3/uL (ref 150–400)
RBC: 4.68 MIL/uL (ref 3.87–5.11)
RDW: 17.2 % — AB (ref 11.5–15.5)
WBC: 8.6 10*3/uL (ref 4.0–10.5)

## 2014-03-14 NOTE — Progress Notes (Signed)
28 week labs today.  No complaints.  Denies contractions, decreased movement, bleeding, spotting.  Labor precautions given.

## 2014-03-14 NOTE — Patient Instructions (Signed)
Second Trimester of Pregnancy The second trimester is from week 13 through week 28, months 4 through 6. The second trimester is often a time when you feel your best. Your body has also adjusted to being pregnant, and you begin to feel better physically. Usually, morning sickness has lessened or quit completely, you may have more energy, and you may have an increase in appetite. The second trimester is also a time when the fetus is growing rapidly. At the end of the sixth month, the fetus is about 9 inches long and weighs about 1 pounds. You will likely begin to feel the baby move (quickening) between 18 and 20 weeks of the pregnancy. BODY CHANGES Your body goes through many changes during pregnancy. The changes vary from woman to woman.   Your weight will continue to increase. You will notice your lower abdomen bulging out.  You may begin to get stretch marks on your hips, abdomen, and breasts.  You may develop headaches that can be relieved by medicines approved by your health care provider.  You may urinate more often because the fetus is pressing on your bladder.  You may develop or continue to have heartburn as a result of your pregnancy.  You may develop constipation because certain hormones are causing the muscles that push waste through your intestines to slow down.  You may develop hemorrhoids or swollen, bulging veins (varicose veins).  You may have back pain because of the weight gain and pregnancy hormones relaxing your joints between the bones in your pelvis and as a result of a shift in weight and the muscles that support your balance.  Your breasts will continue to grow and be tender.  Your gums may bleed and may be sensitive to brushing and flossing.  Dark spots or blotches (chloasma, mask of pregnancy) may develop on your face. This will likely fade after the baby is born.  A dark line from your belly button to the pubic area (linea nigra) may appear. This will likely fade  after the baby is born.  You may have changes in your hair. These can include thickening of your hair, rapid growth, and changes in texture. Some women also have hair loss during or after pregnancy, or hair that feels dry or thin. Your hair will most likely return to normal after your baby is born. WHAT TO EXPECT AT YOUR PRENATAL VISITS During a routine prenatal visit:  You will be weighed to make sure you and the fetus are growing normally.  Your blood pressure will be taken.  Your abdomen will be measured to track your baby's growth.  The fetal heartbeat will be listened to.  Any test results from the previous visit will be discussed. Your health care provider may ask you:  How you are feeling.  If you are feeling the baby move.  If you have had any abnormal symptoms, such as leaking fluid, bleeding, severe headaches, or abdominal cramping.  If you have any questions. Other tests that may be performed during your second trimester include:  Blood tests that check for:  Low iron levels (anemia).  Gestational diabetes (between 24 and 28 weeks).  Rh antibodies.  Urine tests to check for infections, diabetes, or protein in the urine.  An ultrasound to confirm the proper growth and development of the baby.  An amniocentesis to check for possible genetic problems.  Fetal screens for spina bifida and Down syndrome. HOME CARE INSTRUCTIONS   Avoid all smoking, herbs, alcohol, and unprescribed   drugs. These chemicals affect the formation and growth of the baby.  Follow your health care provider's instructions regarding medicine use. There are medicines that are either safe or unsafe to take during pregnancy.  Exercise only as directed by your health care provider. Experiencing uterine cramps is a good sign to stop exercising.  Continue to eat regular, healthy meals.  Wear a good support bra for breast tenderness.  Do not use hot tubs, steam rooms, or saunas.  Wear your  seat belt at all times when driving.  Avoid raw meat, uncooked cheese, cat litter boxes, and soil used by cats. These carry germs that can cause birth defects in the baby.  Take your prenatal vitamins.  Try taking a stool softener (if your health care provider approves) if you develop constipation. Eat more high-fiber foods, such as fresh vegetables or fruit and whole grains. Drink plenty of fluids to keep your urine clear or pale yellow.  Take warm sitz baths to soothe any pain or discomfort caused by hemorrhoids. Use hemorrhoid cream if your health care provider approves.  If you develop varicose veins, wear support hose. Elevate your feet for 15 minutes, 3-4 times a day. Limit salt in your diet.  Avoid heavy lifting, wear low heel shoes, and practice good posture.  Rest with your legs elevated if you have leg cramps or low back pain.  Visit your dentist if you have not gone yet during your pregnancy. Use a soft toothbrush to brush your teeth and be gentle when you floss.  A sexual relationship may be continued unless your health care provider directs you otherwise.  Continue to go to all your prenatal visits as directed by your health care provider. SEEK MEDICAL CARE IF:   You have dizziness.  You have mild pelvic cramps, pelvic pressure, or nagging pain in the abdominal area.  You have persistent nausea, vomiting, or diarrhea.  You have a bad smelling vaginal discharge.  You have pain with urination. SEEK IMMEDIATE MEDICAL CARE IF:   You have a fever.  You are leaking fluid from your vagina.  You have spotting or bleeding from your vagina.  You have severe abdominal cramping or pain.  You have rapid weight gain or loss.  You have shortness of breath with chest pain.  You notice sudden or extreme swelling of your face, hands, ankles, feet, or legs.  You have not felt your baby move in over an hour.  You have severe headaches that do not go away with  medicine.  You have vision changes. Document Released: 04/20/2001 Document Revised: 05/01/2013 Document Reviewed: 06/27/2012 ExitCare Patient Information 2015 [employer]. This information is not intended to replace advice given to you by your health care provider. Make sure you discuss any questions you have with your health care provider.  

## 2014-03-15 LAB — GLUCOSE TOLERANCE, 1 HOUR (50G) W/O FASTING: GLUCOSE 1 HOUR GTT: 64 mg/dL — AB (ref 70–140)

## 2014-03-15 LAB — HIV ANTIBODY (ROUTINE TESTING W REFLEX): HIV: NONREACTIVE

## 2014-03-28 ENCOUNTER — Encounter (HOSPITAL_COMMUNITY): Payer: Self-pay

## 2014-03-28 ENCOUNTER — Other Ambulatory Visit (HOSPITAL_COMMUNITY): Payer: Self-pay | Admitting: Obstetrics and Gynecology

## 2014-03-28 ENCOUNTER — Ambulatory Visit (HOSPITAL_COMMUNITY)
Admission: RE | Admit: 2014-03-28 | Discharge: 2014-03-28 | Disposition: A | Discharge status: Home / Self Care | Payer: Medicaid Other | Source: Ambulatory Visit | Attending: Obstetrics & Gynecology | Admitting: Obstetrics & Gynecology

## 2014-03-28 DIAGNOSIS — O3421 Maternal care for scar from previous cesarean delivery: Secondary | ICD-10-CM | POA: Diagnosis present

## 2014-03-28 DIAGNOSIS — Z3A28 28 weeks gestation of pregnancy: Secondary | ICD-10-CM | POA: Diagnosis not present

## 2014-03-28 DIAGNOSIS — O09299 Supervision of pregnancy with other poor reproductive or obstetric history, unspecified trimester: Secondary | ICD-10-CM | POA: Diagnosis present

## 2014-03-28 DIAGNOSIS — O09523 Supervision of elderly multigravida, third trimester: Secondary | ICD-10-CM | POA: Diagnosis present

## 2014-03-28 DIAGNOSIS — O34219 Maternal care for unspecified type scar from previous cesarean delivery: Secondary | ICD-10-CM

## 2014-03-28 DIAGNOSIS — O09292 Supervision of pregnancy with other poor reproductive or obstetric history, second trimester: Secondary | ICD-10-CM

## 2014-03-28 DIAGNOSIS — Z3689 Encounter for other specified antenatal screening: Secondary | ICD-10-CM

## 2014-03-28 DIAGNOSIS — O09522 Supervision of elderly multigravida, second trimester: Secondary | ICD-10-CM

## 2014-03-28 DIAGNOSIS — Z9884 Bariatric surgery status: Secondary | ICD-10-CM

## 2014-03-28 DIAGNOSIS — Z8632 Personal history of gestational diabetes: Secondary | ICD-10-CM

## 2014-03-28 DIAGNOSIS — O09529 Supervision of elderly multigravida, unspecified trimester: Secondary | ICD-10-CM | POA: Insufficient documentation

## 2014-04-02 ENCOUNTER — Encounter: Payer: Self-pay | Admitting: Obstetrics & Gynecology

## 2014-04-09 ENCOUNTER — Ambulatory Visit (INDEPENDENT_AMBULATORY_CARE_PROVIDER_SITE_OTHER): Payer: Medicaid Other | Admitting: Family Medicine

## 2014-04-09 VITALS — BP 114/75 | HR 93 | Temp 97.8°F | Wt 198.0 lb

## 2014-04-09 DIAGNOSIS — O0992 Supervision of high risk pregnancy, unspecified, second trimester: Secondary | ICD-10-CM

## 2014-04-09 LAB — POCT URINALYSIS DIP (DEVICE)
Bilirubin Urine: NEGATIVE
Glucose, UA: NEGATIVE mg/dL
Hgb urine dipstick: NEGATIVE
KETONES UR: NEGATIVE mg/dL
Nitrite: NEGATIVE
PH: 6.5 (ref 5.0–8.0)
Protein, ur: NEGATIVE mg/dL
Specific Gravity, Urine: 1.025 (ref 1.005–1.030)
UROBILINOGEN UA: 1 mg/dL (ref 0.0–1.0)

## 2014-04-09 NOTE — Patient Instructions (Signed)
Third Trimester of Pregnancy The third trimester is from week 29 through week 42, months 7 through 9. The third trimester is a time when the fetus is growing rapidly. At the end of the ninth month, the fetus is about 20 inches in length and weighs 6-10 pounds.  BODY CHANGES Your body goes through many changes during pregnancy. The changes vary from woman to woman.   Your weight will continue to increase. You can expect to gain 25-35 pounds (11-16 kg) by the end of the pregnancy.  You may begin to get stretch marks on your hips, abdomen, and breasts.  You may urinate more often because the fetus is moving lower into your pelvis and pressing on your bladder.  You may develop or continue to have heartburn as a result of your pregnancy.  You may develop constipation because certain hormones are causing the muscles that push waste through your intestines to slow down.  You may develop hemorrhoids or swollen, bulging veins (varicose veins).  You may have pelvic pain because of the weight gain and pregnancy hormones relaxing your joints between the bones in your pelvis. Backaches may result from overexertion of the muscles supporting your posture.  You may have changes in your hair. These can include thickening of your hair, rapid growth, and changes in texture. Some women also have hair loss during or after pregnancy, or hair that feels dry or thin. Your hair will most likely return to normal after your baby is born.  Your breasts will continue to grow and be tender. A yellow discharge may leak from your breasts called colostrum.  Your belly button may stick out.  You may feel short of breath because of your expanding uterus.  You may notice the fetus "dropping," or moving lower in your abdomen.  You may have a bloody mucus discharge. This usually occurs a few days to a week before labor begins.  Your cervix becomes thin and soft (effaced) near your due date. WHAT TO EXPECT AT YOUR PRENATAL  EXAMS  You will have prenatal exams every 2 weeks until week 36. Then, you will have weekly prenatal exams. During a routine prenatal visit:  You will be weighed to make sure you and the fetus are growing normally.  Your blood pressure is taken.  Your abdomen will be measured to track your baby's growth.  The fetal heartbeat will be listened to.  Any test results from the previous visit will be discussed.  You may have a cervical check near your due date to see if you have effaced. At around 36 weeks, your caregiver will check your cervix. At the same time, your caregiver will also perform a test on the secretions of the vaginal tissue. This test is to determine if a type of bacteria, Group B streptococcus, is present. Your caregiver will explain this further. Your caregiver may ask you:  What your birth plan is.  How you are feeling.  If you are feeling the baby move.  If you have had any abnormal symptoms, such as leaking fluid, bleeding, severe headaches, or abdominal cramping.  If you have any questions. Other tests or screenings that may be performed during your third trimester include:  Blood tests that check for low iron levels (anemia).  Fetal testing to check the health, activity level, and growth of the fetus. Testing is done if you have certain medical conditions or if there are problems during the pregnancy. FALSE LABOR You may feel small, irregular contractions that   eventually go away. These are called Braxton Hicks contractions, or false labor. Contractions may last for hours, days, or even weeks before true labor sets in. If contractions come at regular intervals, intensify, or become painful, it is best to be seen by your caregiver.  SIGNS OF LABOR   Menstrual-like cramps.  Contractions that are 5 minutes apart or less.  Contractions that start on the top of the uterus and spread down to the lower abdomen and back.  A sense of increased pelvic pressure or back  pain.  A watery or bloody mucus discharge that comes from the vagina. If you have any of these signs before the 37th week of pregnancy, call your caregiver right away. You need to go to the hospital to get checked immediately. HOME CARE INSTRUCTIONS   Avoid all smoking, herbs, alcohol, and unprescribed drugs. These chemicals affect the formation and growth of the baby.  Follow your caregiver's instructions regarding medicine use. There are medicines that are either safe or unsafe to take during pregnancy.  Exercise only as directed by your caregiver. Experiencing uterine cramps is a good sign to stop exercising.  Continue to eat regular, healthy meals.  Wear a good support bra for breast tenderness.  Do not use hot tubs, steam rooms, or saunas.  Wear your seat belt at all times when driving.  Avoid raw meat, uncooked cheese, cat litter boxes, and soil used by cats. These carry germs that can cause birth defects in the baby.  Take your prenatal vitamins.  Try taking a stool softener (if your caregiver approves) if you develop constipation. Eat more high-fiber foods, such as fresh vegetables or fruit and whole grains. Drink plenty of fluids to keep your urine clear or pale yellow.  Take warm sitz baths to soothe any pain or discomfort caused by hemorrhoids. Use hemorrhoid cream if your caregiver approves.  If you develop varicose veins, wear support hose. Elevate your feet for 15 minutes, 3-4 times a day. Limit salt in your diet.  Avoid heavy lifting, wear low heal shoes, and practice good posture.  Rest a lot with your legs elevated if you have leg cramps or low back pain.  Visit your dentist if you have not gone during your pregnancy. Use a soft toothbrush to brush your teeth and be gentle when you floss.  A sexual relationship may be continued unless your caregiver directs you otherwise.  Do not travel far distances unless it is absolutely necessary and only with the approval  of your caregiver.  Take prenatal classes to understand, practice, and ask questions about the labor and delivery.  Make a trial run to the hospital.  Pack your hospital bag.  Prepare the baby's nursery.  Continue to go to all your prenatal visits as directed by your caregiver. SEEK MEDICAL CARE IF:  You are unsure if you are in labor or if your water has broken.  You have dizziness.  You have mild pelvic cramps, pelvic pressure, or nagging pain in your abdominal area.  You have persistent nausea, vomiting, or diarrhea.  You have a bad smelling vaginal discharge.  You have pain with urination. SEEK IMMEDIATE MEDICAL CARE IF:   You have a fever.  You are leaking fluid from your vagina.  You have spotting or bleeding from your vagina.  You have severe abdominal cramping or pain.  You have rapid weight loss or gain.  You have shortness of breath with chest pain.  You notice sudden or extreme swelling   of your face, hands, ankles, feet, or legs.  You have not felt your baby move in over an hour.  You have severe headaches that do not go away with medicine.  You have vision changes. Document Released: 04/20/2001 Document Revised: 05/01/2013 Document Reviewed: 06/27/2012 Exit[employer] Patient Information 2015 [employer]. This information is not intended to replace advice given to you by your health care provider. Make sure you discuss any questions you have with your health care provider.  

## 2014-04-09 NOTE — Progress Notes (Signed)
No complaints.  Good fetal activity.  Labor precautions given.

## 2014-04-23 ENCOUNTER — Ambulatory Visit (INDEPENDENT_AMBULATORY_CARE_PROVIDER_SITE_OTHER): Payer: Medicaid Other | Admitting: Obstetrics and Gynecology

## 2014-04-23 ENCOUNTER — Encounter: Payer: Self-pay | Admitting: Obstetrics and Gynecology

## 2014-04-23 VITALS — BP 132/68 | HR 90 | Temp 97.7°F | Wt 196.6 lb

## 2014-04-23 DIAGNOSIS — O99212 Obesity complicating pregnancy, second trimester: Secondary | ICD-10-CM

## 2014-04-23 DIAGNOSIS — O0993 Supervision of high risk pregnancy, unspecified, third trimester: Secondary | ICD-10-CM

## 2014-04-23 DIAGNOSIS — E669 Obesity, unspecified: Secondary | ICD-10-CM

## 2014-04-23 DIAGNOSIS — O3421 Maternal care for scar from previous cesarean delivery: Secondary | ICD-10-CM

## 2014-04-23 DIAGNOSIS — R87811 Vaginal high risk human papillomavirus (HPV) DNA test positive: Secondary | ICD-10-CM

## 2014-04-23 DIAGNOSIS — R87620 Atypical squamous cells of undetermined significance on cytologic smear of vagina (ASC-US): Secondary | ICD-10-CM

## 2014-04-23 DIAGNOSIS — Z9884 Bariatric surgery status: Secondary | ICD-10-CM

## 2014-04-23 DIAGNOSIS — O09523 Supervision of elderly multigravida, third trimester: Secondary | ICD-10-CM

## 2014-04-23 DIAGNOSIS — O09293 Supervision of pregnancy with other poor reproductive or obstetric history, third trimester: Secondary | ICD-10-CM

## 2014-04-23 DIAGNOSIS — Z8632 Personal history of gestational diabetes: Secondary | ICD-10-CM

## 2014-04-23 DIAGNOSIS — O34219 Maternal care for unspecified type scar from previous cesarean delivery: Secondary | ICD-10-CM

## 2014-04-23 LAB — POCT URINALYSIS DIP (DEVICE)
Bilirubin Urine: NEGATIVE
Glucose, UA: 100 mg/dL — AB
HGB URINE DIPSTICK: NEGATIVE
Ketones, ur: NEGATIVE mg/dL
NITRITE: NEGATIVE
PROTEIN: NEGATIVE mg/dL
SPECIFIC GRAVITY, URINE: 1.025 (ref 1.005–1.030)
UROBILINOGEN UA: 2 mg/dL — AB (ref 0.0–1.0)
pH: 6.5 (ref 5.0–8.0)

## 2014-04-23 NOTE — Progress Notes (Signed)
Patient is doing well without complaints. FM/PTL precautions reviewed. Patient scheduled for follow up growth ultrasound on 12/17

## 2014-04-25 ENCOUNTER — Ambulatory Visit (HOSPITAL_COMMUNITY)
Admission: RE | Admit: 2014-04-25 | Discharge: 2014-04-25 | Disposition: A | Discharge status: Home / Self Care | Payer: Medicaid Other | Source: Ambulatory Visit | Attending: Obstetrics & Gynecology | Admitting: Obstetrics & Gynecology

## 2014-04-25 DIAGNOSIS — O99212 Obesity complicating pregnancy, second trimester: Secondary | ICD-10-CM | POA: Diagnosis not present

## 2014-04-25 DIAGNOSIS — O09292 Supervision of pregnancy with other poor reproductive or obstetric history, second trimester: Secondary | ICD-10-CM

## 2014-04-25 DIAGNOSIS — Z8632 Personal history of gestational diabetes: Secondary | ICD-10-CM

## 2014-04-25 DIAGNOSIS — Z9884 Bariatric surgery status: Secondary | ICD-10-CM

## 2014-04-25 DIAGNOSIS — O09523 Supervision of elderly multigravida, third trimester: Secondary | ICD-10-CM | POA: Diagnosis present

## 2014-04-25 DIAGNOSIS — O3421 Maternal care for scar from previous cesarean delivery: Secondary | ICD-10-CM | POA: Diagnosis not present

## 2014-04-25 DIAGNOSIS — E669 Obesity, unspecified: Secondary | ICD-10-CM | POA: Insufficient documentation

## 2014-04-26 DIAGNOSIS — O09529 Supervision of elderly multigravida, unspecified trimester: Secondary | ICD-10-CM | POA: Insufficient documentation

## 2014-05-07 ENCOUNTER — Ambulatory Visit (INDEPENDENT_AMBULATORY_CARE_PROVIDER_SITE_OTHER): Payer: Medicaid Other | Admitting: Family Medicine

## 2014-05-07 VITALS — BP 128/74 | HR 82 | Temp 97.4°F | Wt 197.2 lb

## 2014-05-07 DIAGNOSIS — O0993 Supervision of high risk pregnancy, unspecified, third trimester: Secondary | ICD-10-CM

## 2014-05-07 DIAGNOSIS — O09293 Supervision of pregnancy with other poor reproductive or obstetric history, third trimester: Secondary | ICD-10-CM

## 2014-05-07 DIAGNOSIS — Z8632 Personal history of gestational diabetes: Secondary | ICD-10-CM

## 2014-05-07 DIAGNOSIS — O09523 Supervision of elderly multigravida, third trimester: Secondary | ICD-10-CM

## 2014-05-07 LAB — POCT URINALYSIS DIP (DEVICE)
Bilirubin Urine: NEGATIVE
Glucose, UA: NEGATIVE mg/dL
Hgb urine dipstick: NEGATIVE
KETONES UR: NEGATIVE mg/dL
Nitrite: NEGATIVE
Protein, ur: NEGATIVE mg/dL
SPECIFIC GRAVITY, URINE: 1.025 (ref 1.005–1.030)
Urobilinogen, UA: 4 mg/dL — ABNORMAL HIGH (ref 0.0–1.0)
pH: 6 (ref 5.0–8.0)

## 2014-05-07 NOTE — Progress Notes (Signed)
Occasional braxton hicks.  Good fetal activity.  Having left lower buttock pain in the piriformis.  Will schedule cesarean section No concerns

## 2014-05-07 NOTE — Progress Notes (Signed)
C/o of left lower back/buttocks pain.  Declines flu and tdap.

## 2014-05-16 ENCOUNTER — Encounter: Payer: Medicaid Other | Admitting: Family Medicine

## 2014-05-28 ENCOUNTER — Encounter (HOSPITAL_COMMUNITY): Payer: Self-pay | Admitting: Certified Registered Nurse Anesthetist

## 2014-05-28 ENCOUNTER — Encounter (HOSPITAL_COMMUNITY): Payer: Self-pay | Admitting: Anesthesiology

## 2014-05-28 ENCOUNTER — Inpatient Hospital Stay (HOSPITAL_COMMUNITY)
Admission: AD | Admit: 2014-05-28 | Discharge: 2014-06-02 | DRG: 357 | Disposition: A | Discharge status: Home / Self Care | Payer: Medicaid Other | Source: Ambulatory Visit | Attending: General Surgery | Admitting: General Surgery

## 2014-05-28 ENCOUNTER — Inpatient Hospital Stay (HOSPITAL_COMMUNITY): Payer: Medicaid Other | Admitting: Anesthesiology

## 2014-05-28 ENCOUNTER — Encounter (HOSPITAL_COMMUNITY): Admission: AD | Disposition: A | Discharge status: Home / Self Care | Payer: Self-pay | Source: Ambulatory Visit

## 2014-05-28 ENCOUNTER — Inpatient Hospital Stay (HOSPITAL_COMMUNITY): Payer: Medicaid Other

## 2014-05-28 DIAGNOSIS — Z833 Family history of diabetes mellitus: Secondary | ICD-10-CM

## 2014-05-28 DIAGNOSIS — Z881 Allergy status to other antibiotic agents status: Secondary | ICD-10-CM | POA: Diagnosis not present

## 2014-05-28 DIAGNOSIS — R1011 Right upper quadrant pain: Secondary | ICD-10-CM | POA: Diagnosis present

## 2014-05-28 DIAGNOSIS — K458 Other specified abdominal hernia without obstruction or gangrene: Principal | ICD-10-CM | POA: Diagnosis present

## 2014-05-28 DIAGNOSIS — O09523 Supervision of elderly multigravida, third trimester: Secondary | ICD-10-CM

## 2014-05-28 DIAGNOSIS — Z87891 Personal history of nicotine dependence: Secondary | ICD-10-CM | POA: Diagnosis not present

## 2014-05-28 DIAGNOSIS — O3421 Maternal care for scar from previous cesarean delivery: Secondary | ICD-10-CM | POA: Diagnosis present

## 2014-05-28 DIAGNOSIS — R1084 Generalized abdominal pain: Secondary | ICD-10-CM

## 2014-05-28 DIAGNOSIS — Z8249 Family history of ischemic heart disease and other diseases of the circulatory system: Secondary | ICD-10-CM | POA: Diagnosis not present

## 2014-05-28 DIAGNOSIS — K559 Vascular disorder of intestine, unspecified: Secondary | ICD-10-CM | POA: Diagnosis present

## 2014-05-28 DIAGNOSIS — O0993 Supervision of high risk pregnancy, unspecified, third trimester: Secondary | ICD-10-CM

## 2014-05-28 DIAGNOSIS — R61 Generalized hyperhidrosis: Secondary | ICD-10-CM | POA: Diagnosis present

## 2014-05-28 DIAGNOSIS — Z3A37 37 weeks gestation of pregnancy: Secondary | ICD-10-CM | POA: Insufficient documentation

## 2014-05-28 DIAGNOSIS — Z79899 Other long term (current) drug therapy: Secondary | ICD-10-CM

## 2014-05-28 DIAGNOSIS — O26899 Other specified pregnancy related conditions, unspecified trimester: Secondary | ICD-10-CM | POA: Insufficient documentation

## 2014-05-28 DIAGNOSIS — D62 Acute posthemorrhagic anemia: Secondary | ICD-10-CM | POA: Diagnosis not present

## 2014-05-28 DIAGNOSIS — O9962 Diseases of the digestive system complicating childbirth: Secondary | ICD-10-CM | POA: Diagnosis present

## 2014-05-28 DIAGNOSIS — O99844 Bariatric surgery status complicating childbirth: Secondary | ICD-10-CM

## 2014-05-28 HISTORY — PX: LYSIS OF ADHESION: SHX5961

## 2014-05-28 HISTORY — PX: LAPAROTOMY: SHX154

## 2014-05-28 LAB — TYPE AND SCREEN
ABO/RH(D): A POS
ANTIBODY SCREEN: NEGATIVE

## 2014-05-28 LAB — ABO/RH: ABO/RH(D): A POS

## 2014-05-28 LAB — CBC
HCT: 33.7 % — ABNORMAL LOW (ref 36.0–46.0)
HCT: 30.5 % — ABNORMAL LOW (ref 36.0–46.0)
Hemoglobin: 10.3 g/dL — ABNORMAL LOW (ref 12.0–15.0)
Hemoglobin: 9 g/dL — ABNORMAL LOW (ref 12.0–15.0)
MCH: 20.2 pg — ABNORMAL LOW (ref 26.0–34.0)
MCH: 19.8 pg — AB (ref 26.0–34.0)
MCHC: 30.6 g/dL (ref 30.0–36.0)
MCHC: 29.5 g/dL — ABNORMAL LOW (ref 30.0–36.0)
MCV: 65.9 fL — AB (ref 78.0–100.0)
MCV: 67 fL — AB (ref 78.0–100.0)
PLATELETS: 280 10*3/uL (ref 150–400)
Platelets: 264 10*3/uL (ref 150–400)
RBC: 5.11 MIL/uL (ref 3.87–5.11)
RBC: 4.55 MIL/uL (ref 3.87–5.11)
RDW: 16.2 % — ABNORMAL HIGH (ref 11.5–15.5)
RDW: 16 % — AB (ref 11.5–15.5)
WBC: 9.9 10*3/uL (ref 4.0–10.5)
WBC: 14.9 10*3/uL — AB (ref 4.0–10.5)

## 2014-05-28 LAB — COMPREHENSIVE METABOLIC PANEL
ALBUMIN: 3.2 g/dL — AB (ref 3.5–5.2)
ALT: 15 U/L (ref 0–35)
AST: 24 U/L (ref 0–37)
Alkaline Phosphatase: 133 U/L — ABNORMAL HIGH (ref 39–117)
Anion gap: 10 (ref 5–15)
BUN: 8 mg/dL (ref 6–23)
CHLORIDE: 108 meq/L (ref 96–112)
CO2: 18 mmol/L — AB (ref 19–32)
CREATININE: 0.37 mg/dL — AB (ref 0.50–1.10)
Calcium: 9.2 mg/dL (ref 8.4–10.5)
GLUCOSE: 124 mg/dL — AB (ref 70–99)
POTASSIUM: 3.7 mmol/L (ref 3.5–5.1)
Sodium: 136 mmol/L (ref 135–145)
Total Bilirubin: 0.8 mg/dL (ref 0.3–1.2)
Total Protein: 7.4 g/dL (ref 6.0–8.3)

## 2014-05-28 LAB — DIFFERENTIAL
Basophils Absolute: 0 10*3/uL (ref 0.0–0.1)
Basophils Relative: 0 % (ref 0–1)
EOS ABS: 0.1 10*3/uL (ref 0.0–0.7)
Eosinophils Relative: 1 % (ref 0–5)
Lymphocytes Relative: 28 % (ref 12–46)
Lymphs Abs: 2.7 10*3/uL (ref 0.7–4.0)
MONO ABS: 0.6 10*3/uL (ref 0.1–1.0)
Monocytes Relative: 7 % (ref 3–12)
NEUTROS ABS: 6.3 10*3/uL (ref 1.7–7.7)
Neutrophils Relative %: 64 % (ref 43–77)

## 2014-05-28 LAB — LIPASE, BLOOD: Lipase: 50 U/L (ref 11–59)

## 2014-05-28 LAB — LACTIC ACID, PLASMA
LACTIC ACID, VENOUS: 2.6 mmol/L — AB (ref 0.5–2.2)
Lactic Acid, Venous: 1.4 mmol/L (ref 0.5–2.2)

## 2014-05-28 LAB — MRSA PCR SCREENING: MRSA by PCR: NEGATIVE

## 2014-05-28 LAB — AMYLASE: Amylase: 133 U/L — ABNORMAL HIGH (ref 0–105)

## 2014-05-28 SURGERY — Surgical Case
Anesthesia: Epidural | Site: Abdomen

## 2014-05-28 MED ORDER — FENTANYL CITRATE 0.05 MG/ML IJ SOLN
INTRAMUSCULAR | Status: AC
  Filled 2014-05-28: qty 2

## 2014-05-28 MED ORDER — ROCURONIUM BROMIDE 100 MG/10ML IV SOLN
INTRAVENOUS | Status: DC | PRN
  Administered 2014-05-28: 30 mg via INTRAVENOUS

## 2014-05-28 MED ORDER — ONDANSETRON HCL 4 MG/2ML IJ SOLN: 4.0000 mg | Freq: Three times a day (TID) | INTRAMUSCULAR | Status: DC | PRN

## 2014-05-28 MED ORDER — SCOPOLAMINE 1 MG/3DAYS TD PT72
1.0000 | MEDICATED_PATCH | Freq: Once | TRANSDERMAL | Status: DC
  Filled 2014-05-28: qty 1

## 2014-05-28 MED ORDER — MORPHINE SULFATE (PF) 0.5 MG/ML IJ SOLN
INTRAMUSCULAR | Status: DC | PRN
  Administered 2014-05-28: 3 mg via EPIDURAL

## 2014-05-28 MED ORDER — PHENYLEPHRINE 8 MG IN D5W 100 ML (0.08MG/ML) PREMIX OPTIME
INJECTION | INTRAVENOUS | Status: DC | PRN
  Administered 2014-05-28: 60 ug/min via INTRAVENOUS

## 2014-05-28 MED ORDER — FENTANYL CITRATE 0.05 MG/ML IJ SOLN
25.0000 ug | INTRAMUSCULAR | Status: DC | PRN
  Administered 2014-05-28 (×3): 50 ug via INTRAVENOUS

## 2014-05-28 MED ORDER — CEFOTETAN DISODIUM 2 G IJ SOLR
2.0000 g | INTRAMUSCULAR | Status: DC | PRN
Start: 2014-05-28 — End: 2014-05-28
  Administered 2014-05-28: 2 g via INTRAVENOUS

## 2014-05-28 MED ORDER — KETAMINE HCL 10 MG/ML IJ SOLN
INTRAMUSCULAR | Status: DC | PRN
  Administered 2014-05-28: 30 mg via INTRAVENOUS

## 2014-05-28 MED ORDER — KETOROLAC TROMETHAMINE 30 MG/ML IJ SOLN: 30.0000 mg | Freq: Four times a day (QID) | INTRAMUSCULAR | Status: DC | PRN

## 2014-05-28 MED ORDER — KETAMINE HCL 10 MG/ML IJ SOLN
INTRAMUSCULAR | Status: AC
  Filled 2014-05-28: qty 1

## 2014-05-28 MED ORDER — LACTATED RINGERS IV SOLN
INTRAVENOUS | Status: DC
  Administered 2014-05-28: 125 mL/h via INTRAVENOUS
  Administered 2014-05-28: via INTRAVENOUS
  Administered 2014-05-29: 125 mL/h via INTRAVENOUS

## 2014-05-28 MED ORDER — MEPERIDINE HCL 25 MG/ML IJ SOLN: 6.2500 mg | INTRAMUSCULAR | Status: DC | PRN

## 2014-05-28 MED ORDER — DEXTROSE 5 % IV SOLN
2.0000 g | Freq: Once | INTRAVENOUS | Status: DC
  Filled 2014-05-28: qty 2

## 2014-05-28 MED ORDER — PHENYLEPHRINE HCL 10 MG/ML IJ SOLN
INTRAMUSCULAR | Status: DC | PRN
  Administered 2014-05-28: 80 ug via INTRAVENOUS

## 2014-05-28 MED ORDER — ENOXAPARIN SODIUM 40 MG/0.4ML ~~LOC~~ SOLN
40.0000 mg | SUBCUTANEOUS | Status: DC
  Administered 2014-05-29 – 2014-06-02 (×5): 40 mg via SUBCUTANEOUS
  Filled 2014-05-28 (×6): qty 0.4

## 2014-05-28 MED ORDER — BUPIVACAINE HCL (PF) 0.25 % IJ SOLN
INTRAMUSCULAR | Status: DC | PRN
  Administered 2014-05-28: 30 mL

## 2014-05-28 MED ORDER — ONDANSETRON HCL 4 MG/2ML IJ SOLN
INTRAMUSCULAR | Status: DC | PRN
  Administered 2014-05-28: 4 mg via INTRAVENOUS

## 2014-05-28 MED ORDER — MORPHINE SULFATE 2 MG/ML IJ SOLN
2.0000 mg | INTRAMUSCULAR | Status: DC | PRN
  Administered 2014-05-28: 2 mg via INTRAVENOUS
  Administered 2014-05-28: 4 mg via INTRAVENOUS
  Administered 2014-05-29 (×2): 2 mg via INTRAVENOUS
  Filled 2014-05-28 (×2): qty 1
  Filled 2014-05-28: qty 2
  Filled 2014-05-28 (×3): qty 1

## 2014-05-28 MED ORDER — ONDANSETRON HCL 4 MG/2ML IJ SOLN
4.0000 mg | Freq: Once | INTRAMUSCULAR | Status: AC
  Administered 2014-05-28: 4 mg via INTRAVENOUS
  Filled 2014-05-28: qty 2

## 2014-05-28 MED ORDER — ACETAMINOPHEN 500 MG PO TABS
1000.0000 mg | ORAL_TABLET | Freq: Four times a day (QID) | ORAL | Status: AC
  Administered 2014-05-28 – 2014-05-29 (×3): 1000 mg via ORAL
  Filled 2014-05-28 (×3): qty 2

## 2014-05-28 MED ORDER — DIPHENHYDRAMINE HCL 50 MG/ML IJ SOLN
INTRAMUSCULAR | Status: DC | PRN
  Administered 2014-05-28: 12.5 mg via INTRAVENOUS

## 2014-05-28 MED ORDER — FENTANYL CITRATE 0.05 MG/ML IJ SOLN
INTRAMUSCULAR | Status: AC
  Administered 2014-05-28: 50 ug via INTRAVENOUS
  Filled 2014-05-28: qty 2

## 2014-05-28 MED ORDER — PROPOFOL 10 MG/ML IV BOLUS
INTRAVENOUS | Status: AC
  Filled 2014-05-28: qty 20

## 2014-05-28 MED ORDER — NALBUPHINE HCL 10 MG/ML IJ SOLN
5.0000 mg | INTRAMUSCULAR | Status: DC | PRN
  Filled 2014-05-28: qty 0.5

## 2014-05-28 MED ORDER — PROMETHAZINE HCL 25 MG/ML IJ SOLN
25.0000 mg | Freq: Once | INTRAMUSCULAR | Status: AC
  Administered 2014-05-28: 25 mg via INTRAVENOUS
  Filled 2014-05-28: qty 1

## 2014-05-28 MED ORDER — FENTANYL CITRATE 0.05 MG/ML IJ SOLN
50.0000 ug | Freq: Once | INTRAMUSCULAR | Status: AC
  Administered 2014-05-28: 50 ug via INTRAVENOUS
  Filled 2014-05-28: qty 2

## 2014-05-28 MED ORDER — FENTANYL CITRATE 0.05 MG/ML IJ SOLN
INTRAMUSCULAR | Status: DC | PRN
  Administered 2014-05-28 (×2): 25 ug via INTRAVENOUS
  Administered 2014-05-28: 100 ug via EPIDURAL

## 2014-05-28 MED ORDER — FENTANYL CITRATE 0.05 MG/ML IJ SOLN
50.0000 ug | Freq: Once | INTRAMUSCULAR | Status: AC
  Administered 2014-05-28: 50 ug via INTRAVENOUS

## 2014-05-28 MED ORDER — SODIUM CHLORIDE 0.9 % IJ SOLN: 3.0000 mL | INTRAMUSCULAR | Status: DC | PRN

## 2014-05-28 MED ORDER — CEFAZOLIN SODIUM-DEXTROSE 2-3 GM-% IV SOLR
INTRAVENOUS | Status: DC | PRN
Start: 2014-05-28 — End: 2014-05-28
  Administered 2014-05-28: 2 g via INTRAVENOUS

## 2014-05-28 MED ORDER — BUPIVACAINE HCL (PF) 0.25 % IJ SOLN
INTRAMUSCULAR | Status: AC
  Filled 2014-05-28: qty 30

## 2014-05-28 MED ORDER — ROCURONIUM BROMIDE 100 MG/10ML IV SOLN
INTRAVENOUS | Status: AC
  Filled 2014-05-28: qty 1

## 2014-05-28 MED ORDER — NALOXONE HCL 1 MG/ML IJ SOLN: 1.0000 ug/kg/h | INTRAMUSCULAR | Status: DC | PRN

## 2014-05-28 MED ORDER — OXYTOCIN 40 UNITS IN LACTATED RINGERS INFUSION - SIMPLE MED
INTRAVENOUS | Status: DC | PRN
  Administered 2014-05-28: 40 [IU] via INTRAVENOUS

## 2014-05-28 MED ORDER — ACETAMINOPHEN 160 MG/5ML PO SOLN: 325.0000 mg | ORAL | Status: DC | PRN

## 2014-05-28 MED ORDER — PHENYLEPHRINE 40 MCG/ML (10ML) SYRINGE FOR IV PUSH (FOR BLOOD PRESSURE SUPPORT)
PREFILLED_SYRINGE | INTRAVENOUS | Status: AC
  Filled 2014-05-28: qty 10

## 2014-05-28 MED ORDER — NALBUPHINE HCL 20 MG/ML IJ SOLN: 5.0000 mg | INTRAMUSCULAR | Status: DC | PRN

## 2014-05-28 MED ORDER — GLYCOPYRROLATE 0.2 MG/ML IJ SOLN
INTRAMUSCULAR | Status: AC
  Filled 2014-05-28: qty 3

## 2014-05-28 MED ORDER — LIDOCAINE-EPINEPHRINE (PF) 2 %-1:200000 IJ SOLN
INTRAMUSCULAR | Status: AC
  Filled 2014-05-28: qty 40

## 2014-05-28 MED ORDER — KETOROLAC TROMETHAMINE 30 MG/ML IJ SOLN: 30.0000 mg | Freq: Once | INTRAMUSCULAR | Status: DC | PRN

## 2014-05-28 MED ORDER — GLYCOPYRROLATE 0.2 MG/ML IJ SOLN
INTRAMUSCULAR | Status: DC | PRN
  Administered 2014-05-28: 0.6 mg via INTRAVENOUS

## 2014-05-28 MED ORDER — MIDAZOLAM HCL 2 MG/2ML IJ SOLN
0.5000 mg | Freq: Once | INTRAMUSCULAR | Status: DC | PRN
Start: 2014-05-28 — End: 2014-05-28

## 2014-05-28 MED ORDER — LACTATED RINGERS IV BOLUS (SEPSIS)
1000.0000 mL | Freq: Once | INTRAVENOUS | Status: AC
  Administered 2014-05-28: 1000 mL via INTRAVENOUS

## 2014-05-28 MED ORDER — NEOSTIGMINE METHYLSULFATE 10 MG/10ML IV SOLN
INTRAVENOUS | Status: DC | PRN
  Administered 2014-05-28: 4 mg via INTRAVENOUS

## 2014-05-28 MED ORDER — CEFAZOLIN SODIUM-DEXTROSE 2-3 GM-% IV SOLR: 2.0000 g | INTRAVENOUS | Status: DC

## 2014-05-28 MED ORDER — MORPHINE SULFATE 0.5 MG/ML IJ SOLN
INTRAMUSCULAR | Status: AC
  Filled 2014-05-28: qty 10

## 2014-05-28 MED ORDER — ONDANSETRON HCL 4 MG/2ML IJ SOLN: 4.0000 mg | Freq: Four times a day (QID) | INTRAMUSCULAR | Status: DC | PRN

## 2014-05-28 MED ORDER — LACTATED RINGERS IV SOLN
INTRAVENOUS | Status: DC | PRN
  Administered 2014-05-28: 08:00:00 via INTRAVENOUS

## 2014-05-28 MED ORDER — OXYTOCIN 10 UNIT/ML IJ SOLN
INTRAMUSCULAR | Status: AC
  Filled 2014-05-28: qty 4

## 2014-05-28 MED ORDER — IBUPROFEN 200 MG PO TABS
600.0000 mg | ORAL_TABLET | Freq: Four times a day (QID) | ORAL | Status: DC | PRN
  Administered 2014-05-29: 600 mg via ORAL
  Filled 2014-05-28: qty 3

## 2014-05-28 MED ORDER — ONDANSETRON HCL 4 MG/2ML IJ SOLN
INTRAMUSCULAR | Status: AC
  Filled 2014-05-28: qty 2

## 2014-05-28 MED ORDER — ACETAMINOPHEN 325 MG PO TABS: 325.0000 mg | ORAL_TABLET | ORAL | Status: DC | PRN

## 2014-05-28 MED ORDER — NALBUPHINE HCL 10 MG/ML IJ SOLN: 5.0000 mg | Freq: Once | INTRAMUSCULAR | Status: AC | PRN

## 2014-05-28 MED ORDER — PROMETHAZINE HCL 25 MG/ML IJ SOLN
6.2500 mg | INTRAMUSCULAR | Status: DC | PRN
Start: 2014-05-28 — End: 2014-05-28

## 2014-05-28 MED ORDER — MEPERIDINE HCL 25 MG/ML IJ SOLN
6.2500 mg | INTRAMUSCULAR | Status: DC | PRN
Start: 2014-05-28 — End: 2014-05-28

## 2014-05-28 MED ORDER — LACTATED RINGERS IV SOLN
INTRAVENOUS | Status: DC | PRN
  Administered 2014-05-28: 09:00:00 via INTRAVENOUS

## 2014-05-28 MED ORDER — MIDAZOLAM HCL 2 MG/2ML IJ SOLN
INTRAMUSCULAR | Status: AC
  Filled 2014-05-28: qty 2

## 2014-05-28 MED ORDER — NALOXONE HCL 0.4 MG/ML IJ SOLN: 0.4000 mg | INTRAMUSCULAR | Status: DC | PRN

## 2014-05-28 MED ORDER — LACTATED RINGERS IV SOLN
INTRAVENOUS | Status: DC | PRN
  Administered 2014-05-28 (×3): via INTRAVENOUS

## 2014-05-28 MED ORDER — NEOSTIGMINE METHYLSULFATE 10 MG/10ML IV SOLN
INTRAVENOUS | Status: AC
  Filled 2014-05-28: qty 1

## 2014-05-28 MED ORDER — CITRIC ACID-SODIUM CITRATE 334-500 MG/5ML PO SOLN
30.0000 mL | Freq: Once | ORAL | Status: AC
  Administered 2014-05-28: 30 mL via ORAL

## 2014-05-28 MED ORDER — PROPOFOL 10 MG/ML IV BOLUS
INTRAVENOUS | Status: DC | PRN
  Administered 2014-05-28: 140 mg via INTRAVENOUS

## 2014-05-28 MED ORDER — DIPHENHYDRAMINE HCL 50 MG/ML IJ SOLN
INTRAMUSCULAR | Status: AC
  Filled 2014-05-28: qty 1

## 2014-05-28 MED ORDER — LACTATED RINGERS IV SOLN: INTRAVENOUS | Status: DC

## 2014-05-28 MED ORDER — PHENYLEPHRINE 8 MG IN D5W 100 ML (0.08MG/ML) PREMIX OPTIME
INJECTION | INTRAVENOUS | Status: AC
  Filled 2014-05-28: qty 100

## 2014-05-28 MED ORDER — SUCCINYLCHOLINE CHLORIDE 20 MG/ML IJ SOLN
INTRAMUSCULAR | Status: AC
  Filled 2014-05-28: qty 1

## 2014-05-28 MED ORDER — ONDANSETRON HCL 4 MG PO TABS: 4.0000 mg | ORAL_TABLET | Freq: Four times a day (QID) | ORAL | Status: DC | PRN

## 2014-05-28 MED ORDER — DIPHENHYDRAMINE HCL 25 MG PO CAPS: 25.0000 mg | ORAL_CAPSULE | ORAL | Status: DC | PRN

## 2014-05-28 MED ORDER — DIPHENHYDRAMINE HCL 50 MG/ML IJ SOLN: 12.5000 mg | INTRAMUSCULAR | Status: DC | PRN

## 2014-05-28 MED ORDER — SUCCINYLCHOLINE CHLORIDE 20 MG/ML IJ SOLN
INTRAMUSCULAR | Status: DC | PRN
  Administered 2014-05-28: 100 mg via INTRAVENOUS

## 2014-05-28 MED ORDER — FAMOTIDINE IN NACL 20-0.9 MG/50ML-% IV SOLN
20.0000 mg | Freq: Once | INTRAVENOUS | Status: AC
  Administered 2014-05-28: 20 mg via INTRAVENOUS

## 2014-05-28 MED ORDER — SODIUM BICARBONATE 8.4 % IV SOLN
INTRAVENOUS | Status: AC
  Filled 2014-05-28: qty 50

## 2014-05-28 MED ORDER — LIDOCAINE-EPINEPHRINE (PF) 2 %-1:200000 IJ SOLN
INTRAMUSCULAR | Status: DC | PRN
  Administered 2014-05-28: 5 mL
  Administered 2014-05-28: 4 mL
  Administered 2014-05-28: 5 mL
  Administered 2014-05-28: 6 mL
  Administered 2014-05-28 (×2): 5 mL

## 2014-05-28 MED ORDER — MIDAZOLAM HCL 5 MG/5ML IJ SOLN
INTRAMUSCULAR | Status: DC | PRN
  Administered 2014-05-28: 2 mg via INTRAVENOUS

## 2014-05-28 SURGICAL SUPPLY — 56 items
BENZOIN TINCTURE PRP APPL 2/3 (GAUZE/BANDAGES/DRESSINGS) ×3 IMPLANT
BLADE SURG 10 STRL SS (BLADE) ×3 IMPLANT
CATH ROBINSON RED A/P 16FR (CATHETERS) IMPLANT
CLAMP CORD UMBIL (MISCELLANEOUS) IMPLANT
CLOSURE WOUND 1/2 X4 (GAUZE/BANDAGES/DRESSINGS) ×1
CLOTH BEACON ORANGE TIMEOUT ST (SAFETY) ×3 IMPLANT
COVER TABLE BACK 60X90 (DRAPES) ×3 IMPLANT
DRAPE SHEET LG 3/4 BI-LAMINATE (DRAPES) IMPLANT
DRESSING TELFA 8X3 (GAUZE/BANDAGES/DRESSINGS) ×3 IMPLANT
DRSG OPSITE POSTOP 4X10 (GAUZE/BANDAGES/DRESSINGS) ×3 IMPLANT
DRSG PAD ABDOMINAL 8X10 ST (GAUZE/BANDAGES/DRESSINGS) ×6 IMPLANT
DURAPREP 26ML APPLICATOR (WOUND CARE) ×3 IMPLANT
ELECT REM PT RETURN 9FT ADLT (ELECTROSURGICAL) ×3
ELECTRODE REM PT RTRN 9FT ADLT (ELECTROSURGICAL) ×1 IMPLANT
EXTRACTOR VACUUM M CUP 4 TUBE (SUCTIONS) IMPLANT
EXTRACTOR VACUUM M CUP 4' TUBE (SUCTIONS)
GLOVE BIO SURGEON STRL SZ7.5 (GLOVE) ×3 IMPLANT
GLOVE BIOGEL PI IND STRL 7.0 (GLOVE) ×3 IMPLANT
GLOVE BIOGEL PI IND STRL 7.5 (GLOVE) ×2 IMPLANT
GLOVE BIOGEL PI INDICATOR 7.0 (GLOVE) ×6
GLOVE BIOGEL PI INDICATOR 7.5 (GLOVE) ×4
GLOVE ECLIPSE 7.5 STRL STRAW (GLOVE) ×3 IMPLANT
GLOVE SURG SS PI 7.0 STRL IVOR (GLOVE) ×45 IMPLANT
GOWN STRL REUS W/TWL LRG LVL3 (GOWN DISPOSABLE) ×9 IMPLANT
KIT ABG SYR 3ML LUER SLIP (SYRINGE) IMPLANT
NEEDLE HYPO 22GX1.5 SAFETY (NEEDLE) ×3 IMPLANT
NEEDLE HYPO 25X5/8 SAFETYGLIDE (NEEDLE) IMPLANT
NS IRRIG 1000ML POUR BTL (IV SOLUTION) ×3 IMPLANT
PACK C SECTION WH (CUSTOM PROCEDURE TRAY) ×3 IMPLANT
PAD OB MATERNITY 4.3X12.25 (PERSONAL CARE ITEMS) ×3 IMPLANT
RTRCTR C-SECT PINK 25CM LRG (MISCELLANEOUS) IMPLANT
SHEET LAVH (DRAPES) ×3 IMPLANT
SPONGE GAUZE 4X4 12PLY (GAUZE/BANDAGES/DRESSINGS) ×3 IMPLANT
SPONGE LAP 18X18 X RAY DECT (DISPOSABLE) ×15 IMPLANT
STRIP CLOSURE SKIN 1/2X4 (GAUZE/BANDAGES/DRESSINGS) ×2 IMPLANT
SUT MNCRL 0 VIOLET CTX 36 (SUTURE) IMPLANT
SUT MONOCRYL 0 CTX 36 (SUTURE)
SUT PDS AB 0 CTX 60 (SUTURE) ×6 IMPLANT
SUT PDS AB 1 CTX 36 (SUTURE) ×15 IMPLANT
SUT PDS AB 2-0 CT1 27 (SUTURE) ×6 IMPLANT
SUT SILK 2 0 TIES 17X18 (SUTURE) ×2
SUT SILK 2-0 18XBRD TIE BLK (SUTURE) ×1 IMPLANT
SUT VIC AB 0 CTX 36 (SUTURE) ×6
SUT VIC AB 0 CTX36XBRD ANBCTRL (SUTURE) ×3 IMPLANT
SUT VIC AB 2-0 CT1 18 (SUTURE) ×3 IMPLANT
SUT VIC AB 2-0 CT1 27 (SUTURE) ×2
SUT VIC AB 2-0 CT1 TAPERPNT 27 (SUTURE) ×1 IMPLANT
SUT VIC AB 2-0 SH 27 (SUTURE) ×10
SUT VIC AB 2-0 SH 27XBRD (SUTURE) ×5 IMPLANT
SUT VIC AB 3-0 SH 18 (SUTURE) ×3 IMPLANT
SUT VIC AB 4-0 KS 27 (SUTURE) ×3 IMPLANT
SYR 30ML LL (SYRINGE) ×3 IMPLANT
TAPE CLOTH SURG 6X10 WHT LF (GAUZE/BANDAGES/DRESSINGS) ×3 IMPLANT
TOWEL NATURAL 10PK STERILE (DISPOSABLE) ×6 IMPLANT
TOWEL OR 17X24 6PK STRL BLUE (TOWEL DISPOSABLE) ×3 IMPLANT
TRAY FOLEY CATH 14FR (SET/KITS/TRAYS/PACK) ×3 IMPLANT

## 2014-05-28 NOTE — MAU Note (Signed)
Central nursery, house coverage and L&D circulator called about pending c/s.

## 2014-05-28 NOTE — H&P (Signed)
HPI  Barbara Burton is a 39 y.o. a G5P4004 at [redacted]w[redacted]d who presents today with abdominal pain. She states that around 0300 today she started to have pain and possibly contractions. She states that the pain is mostly in the RUQ and radiates to the back. Although occasionally she will feel the pain in her lower abdomen. She then woke up her husband around 0400. She denies any vaginal bleeding or LOF. She reports fetal movement. She states that she has had 4 c-sections and has had gastric bypass.   She states that she had pain similar to this in July. She tried PPI and GI cocktail, and had minimal relief. The pain eventually went away on it's own. She has had her gallbladder removed.   She has been diaphoretic, bradycardic, and fetal heart tracing has been trending into the 90s.  Past Medical History  Diagnosis Date  . Medical history non-contributory     Past Surgical History  Procedure Laterality Date  . Gastric bypass    . Cesarean section    . Gallbladder surgery      Family History  Problem Relation Age of Onset  . Diabetes Mother   . Hypertension Father   . Heart disease Maternal Grandmother     History  Substance Use Topics  . Smoking status: Former Smoker    Types: Cigarettes  . Smokeless tobacco: Never Used  . Alcohol Use: No    Allergies:  Allergies  Allergen Reactions  . Sulfa Antibiotics Rash    Prescriptions prior to admission  Medication Sig Dispense Refill Last Dose  . acetaminophen (TYLENOL) 500 MG tablet Take 1,000 mg by mouth every 4 (four) hours as needed for mild pain or moderate pain.    05/27/2014 at Unknown time  . BIOTIN PO Take 1 tablet by mouth daily.   05/27/2014 at Unknown time  . CALCIUM PO Take 1 tablet by mouth daily.   05/27/2014 at Unknown time  . Cyanocobalamin (VITAMIN B-12 PO) Take 1 tablet by mouth daily.   05/27/2014 at Unknown time  .  ferrous sulfate (FERROUSUL) 325 (65 FE) MG tablet Take 1 tablet (325 mg total) by mouth 3 (three) times daily with meals. 90 tablet 1 05/27/2014 at Unknown time  . Prenatal Vit-Fe Fumarate-FA (PRENATAL MULTIVITAMIN) TABS tablet Take 1 tablet by mouth daily at 12 noon.   05/27/2014 at Unknown time  . ACCU-CHEK FASTCLIX LANCETS MISC Inject 1 each into the skin 4 (four) times daily. 440.34 for testing 4 times daily (Patient not taking: Reported on 04/09/2014) 102 each 12 Not Taking  . famotidine (PEPCID) 20 MG tablet Take 1 tablet (20 mg total) by mouth 2 (two) times daily. (Patient not taking: Reported on 04/09/2014) 60 tablet 3 Not Taking  . glucose blood (ACCU-CHEK SMARTVIEW) test strip One strip 4 times daily. (Patient not taking: Reported on 04/09/2014) 50 each 12 Not Taking    ROS Physical Exam   Last menstrual period 09/09/2013.  Physical Exam  Nursing note and vitals reviewed. Constitutional: She is oriented to person, place, and time. She appears well-developed and well-nourished. She appears distressed (patient is screaming in pain ).  Cardiovascular:  bradycardic at times.  Respiratory: Effort normal.  GI: Soft. There is no tenderness. There is no rebound.  Genitourinary:  Cervix is closed/thick/firm/-2  Neurological: She is alert and oriented to person, place, and time.  Skin: Skin is warm and dry.  Psychiatric: She has a normal mood and affect.   FHT: 135, moderate  Toco: q 4 min contractions  0600: FHR now 110's with moderate variability  MAU Course  Procedures    Lab Results Last 24 Hours    Results for orders placed or performed during the hospital encounter of 05/28/14 (from the past 24 hour(s))  CBC Status: Abnormal   Collection Time: 05/28/14 5:25 AM  Result Value Ref Range   WBC 9.9 4.0 - 10.5 K/uL   RBC 5.11 3.87 - 5.11 MIL/uL   Hemoglobin 10.3 (L) 12.0 - 15.0 g/dL   HCT 16.133.7 (L) 09.636.0 - 04.546.0 %    MCV 65.9 (L) 78.0 - 100.0 fL   MCH 20.2 (L) 26.0 - 34.0 pg   MCHC 30.6 30.0 - 36.0 g/dL   RDW 40.916.2 (H) 81.111.5 - 91.415.5 %   Platelets 280 150 - 400 K/uL  Comprehensive metabolic panel Status: Abnormal   Collection Time: 05/28/14 5:25 AM  Result Value Ref Range   Sodium 136 135 - 145 mmol/L   Potassium 3.7 3.5 - 5.1 mmol/L   Chloride 108 96 - 112 mEq/L   CO2 18 (L) 19 - 32 mmol/L   Glucose, Bld 124 (H) 70 - 99 mg/dL   BUN 8 6 - 23 mg/dL   Creatinine, Ser 7.820.37 (L) 0.50 - 1.10 mg/dL   Calcium 9.2 8.4 - 95.610.5 mg/dL   Total Protein 7.4 6.0 - 8.3 g/dL   Albumin 3.2 (L) 3.5 - 5.2 g/dL   AST 24 0 - 37 U/L   ALT 15 0 - 35 U/L   Alkaline Phosphatase 133 (H) 39 - 117 U/L   Total Bilirubin 0.8 0.3 - 1.2 mg/dL   GFR calc non Af Amer >90 >90 mL/min   GFR calc Af Amer >90 >90 mL/min   Anion gap 10 5 - 15  Type and screen Status: None   Collection Time: 05/28/14 5:25 AM  Result Value Ref Range   ABO/RH(D) A POS    Antibody Screen NEG    Sample Expiration 05/31/2014   Differential Status: None   Collection Time: 05/28/14 5:25 AM  Result Value Ref Range   Neutrophils Relative % 64 43 - 77 %   Neutro Abs 6.3 1.7 - 7.7 K/uL   Lymphocytes Relative 28 12 - 46 %   Lymphs Abs 2.7 0.7 - 4.0 K/uL   Monocytes Relative 7 3 - 12 %   Monocytes Absolute 0.6 0.1 - 1.0 K/uL   Eosinophils Relative 1 0 - 5 %   Eosinophils Absolute 0.1 0.0 - 0.7 K/uL   Basophils Relative 0 0 - 1 %   Basophils Absolute 0.0 0.0 - 0.1 K/uL       Imaging Results (Last 48 hours)    [place] Abdomen Complete  05/28/2014 CLINICAL DATA: Third trimester pregnancy with generalized abdominal pain EXAM: ULTRASOUND ABDOMEN COMPLETE COMPARISON: None. FINDINGS: Gallbladder: Cholecystectomy Common bile duct: Diameter: 3 mm Liver: No focal lesion identified. Within  normal limits in parenchymal echogenicity. IVC: No abnormality visualized. Pancreas: Visualized portion unremarkable. Spleen: Size and appearance within normal limits. Right Kidney: Length: 12 cm. Echogenicity within normal limits. No mass or hydronephrosis visualized. Left Kidney: Length: 12 cm. Echogenicity within normal limits. No mass or hydronephrosis visualized. Abdominal aorta: No aneurysm visualized. Other findings: None. IMPRESSION: 1. No findings to explain abdominal pain. 2. Cholecystectomy. Electronically Signed By: Tiburcio PeaJonathan Watts M.D. On: 05/28/2014 06:30      Assessment and Plan  1. 38yo G5P4004 2.  4 prior cesarean deliveries 3.  IUP at 37.2 weeks 4.  H/o gastric bypass 5.  Generalized abdominal pain 6.  Nonreassuring FHT  With concerns of fetal nonreassurance, will do cesarean section.  I will assist Dr Jolayne Panther, due to extensive prior cesareans.  Also discussed the patient with Dr Maisie Fus of general surgery, who will be here to explore the abdomen delivery.  The risks of cesarean section discussed with the patient included but were not limited to: bleeding which may require transfusion or reoperation; infection which may require antibiotics; injury to bowel, bladder, ureters or other surrounding organs; injury to the fetus; need for additional procedures including hysterectomy in the event of a life-threatening hemorrhage; placental abnormalities wth subsequent pregnancies, incisional problems, thromboembolic phenomenon and other postoperative/anesthesia complications. The patient concurred with the proposed plan, giving informed written consent for the procedure.   Patient has been NPO since last night she will remain NPO for procedure. Anesthesia and OR aware.  Preoperative prophylactic Ancef ordered on call to the OR.  To OR when ready.

## 2014-05-28 NOTE — Op Note (Addendum)
Barbara Burton PROCEDURE DATE: 05/28/2014  PREOPERATIVE DIAGNOSIS: Intrauterine pregnancy at  5024w2d weeks gestation; non-reassuring fetal status with abdominal pain and history of previous cesarean section x 4  POSTOPERATIVE DIAGNOSIS: The same  PROCEDURE:     Repeat cesarean section  SURGEON:  Dr. Catalina AntiguaPeggy Tuleen Burton  ASSISTANT: Dr. Candelaria CelesteJacob Burton  INDICATIONS: Barbara Burton is a 39 y.o. G5P5000 at 8624w2d scheduled for cesarean section secondary to non-reassuring fetal status in the setting of previous cesarean section x 4 and abdominal pain.  The risks of cesarean section discussed with the patient included but were not limited to: bleeding which may require transfusion or reoperation; infection which may require antibiotics; injury to bowel, bladder, ureters or other surrounding organs; injury to the fetus; need for additional procedures including hysterectomy in the event of a life-threatening hemorrhage; placental abnormalities wth subsequent pregnancies, incisional problems, thromboembolic phenomenon and other postoperative/anesthesia complications. The patient concurred with the proposed plan, giving informed written consent for the procedure.    FINDINGS:  Viable female infant in cephalic presentation.  Apgars 7 and 8, weight, 5 pounds and 12 ounces.  Clear amniotic fluid.  Intact placenta, three vessel cord.  Normal uterus, fallopian tubes and ovaries bilaterally. Internal hernia with small bowel ischemia noted and repaired by general surgery (see separate operative note)  ANESTHESIA:    Epidural INTRAVENOUS FLUIDS:1300 ml ESTIMATED BLOOD LOSS: 700 ml URINE OUTPUT:  200 ml SPECIMENS: Placenta sent to L&D COMPLICATIONS: None immediate  PROCEDURE IN DETAIL:  The patient received intravenous antibiotics and had sequential compression devices applied to her lower extremities while in the preoperative area.  She was then taken to the operating room where anesthesia was induced and was found to  be adequate. A foley catheter was placed into her bladder and attached to Barbara Burton gravity. She was then placed in a dorsal supine position with a leftward tilt, and prepped and draped in a sterile manner. After an adequate timeout was performed, a vertical skin incision was made with scalpel and carried through to the underlying layer of fascia. The fascia was incised in the midline and this incision was extended superiorly and inferiorly using the Bovie. Kocher clamps were applied to the left side of the fascial incision and the underlying rectus muscles were dissected off bluntly. The rectus muscles were separated in the midline bluntly and the peritoneum was entered bluntly. The Alexis self-retaining retractor was introduced into the abdominal cavity. Attention was turned to the lower uterine segment where filmy adhesions were dissected off between uterus and bladder, and a transverse hysterotomy was made with a scalpel and extended bilaterally bluntly. The infant was successfully delivered, and cord was clamped and cut and infant was handed over to awaiting neonatology team. An attempt was made to collect cord gases, but a minimal amount was collected. Uterine massage was then administered and the placenta delivered intact with three-vessel cord. The uterus was cleared of clot and debris.  The hysterotomy was closed with 0 Vicryl in a running locked fashion, and an imbricating layer was also placed with a 0 Vicryl. Overall, excellent hemostasis was noted. Hemostasis was confirmed on all surfaces.  The general surgeon at this point performed an exploratory laparotomy (see separate op note)  Mills Mitton,PEGGYMD  05/28/2014 10:14 AM

## 2014-05-28 NOTE — Progress Notes (Signed)
Pt pumped breasts with assistance from Hartford FinancialErin Bull RN; and milk placed  on ice in cooler in room. Fundus checked q hr and remains firm with level at approx the umbilicus( estimated, as the abdomen is covered by a large dressing.) Mod amount of bloody lochia present on peripad. Gunhild Bautch, Georga HackingSusan M, RN

## 2014-05-28 NOTE — Progress Notes (Signed)
Dr. Adrian BlackwaterStinson informed of pt's HR in the 30's & 40's, FHR in the 90's - discussed ordering EKG.

## 2014-05-28 NOTE — Op Note (Signed)
05/28/2014  11:01 AM  PATIENT:  Barbara Burton  39 y.o. female  Patient Care Team: Provider Default, MD as PCP - General  PRE-OPERATIVE DIAGNOSIS:  nonreassuring fetal heart tracing  POST-OPERATIVE DIAGNOSIS:  nonreassuring fetal heart tracing; internal hernia of small bowel  PROCEDURE:  Procedure(s): CESAREAN SECTION EXPLORATORY LAPAROTOMY LYSIS OF ADHESION WITH REDUCTION OF INTERNAL HERNIA   Surgeon(s): Ovidio Kinavid Newman, MD Catalina AntiguaPeggy Constant, MD Levie HeritageJacob J Stinson, DO Romie LeveeAlicia Terrius Gentile, MD  ASSISTANT: Ovidio Kinavid Newman, MD   ANESTHESIA:   general  EBL:  Total I/O In: 4500 [I.V.:4500] Out: 1000 [Urine:200; Blood:800]  DRAINS: none   SPECIMEN:  No Specimen  DISPOSITION OF SPECIMEN:  N/A  COUNTS:  YES  PLAN OF CARE: admit to ICU  PATIENT DISPOSITION:  ICU - extubated and stable.  INDICATION: This is a 39 year old female who began to have severe abdominal pain at 3 AM this morning. She was [redacted] weeks pregnant. [facility] and [facility]. Fetal heart tones became bradycardic and she was taken to the OR for emergent C-section. We were called to evaluate her sudden onset abdominal pain and nausea and vomiting. I was called to the operating room to assist with their evaluation.   OR FINDINGS: Internal Vonita MossPeterson type hernia.  Viable BP limb. Viable common channel. Dusky Roux limb.  DESCRIPTION: the patient was identified in the preoperative holding area and taken to the OR where they were laid supine on the operating room table.  General anesthesia was induced without difficulty. SCDs were also noted to be in place prior to the initiation of anesthesia.  The patient was then prepped and draped in the usual sterile fashion.  I entered the room after the C-section was completed. The uterus was closed. Hemostasis was good.   A surgical timeout was performed indicating the correct patient, procedure, positioning and need for preoperative antibiotics.  I extended the  vertical lower midline incision up past the umbilicus and evaluated the entire small bowel. There was an obvious internal hernia noted with dusky-appearing small intestines down to mid jejunum. I reduce the internal hernia by following the terminal ileum back to the jejunostomy and reduce this. I did have to transect a small amount of mesentery to reduce the jejunostomy. After this was completed the small bowel perfused well except for a portion of the small intestines proximal to the jejunojejunostomy which appeared dusky. We placed this back into the abdomen and waited approximately 30 minutes. I called one of my partners, Dr. Ovidio Kinavid Newman to help evaluate this. When we reevaluated the Roux limb it appeared to be perfusing better. There was good peristalsis and pulsatile flow noted. There was a slight dusky appearance to the bowel from the level of the gastrojejunostomy down to just proximal to the jejunojejunostomy. We decided to loosely close the omentum over this to help prevent a recurrence of the internal hernia but not compromise any more blood supply.  We then closed the abdomen with 2 0 looped PDS sutures.  The skin was closed with staples.  A sterile dressing was applied.  The patient was awakened and sent to the PACU in stable condition.  All counts were correct per OR staff.  [facility]. I discussed this with her husband after the case.

## 2014-05-28 NOTE — MAU Note (Signed)
Dr. Adrian BlackwaterStinson @ bedside, plan for C/S discussed with pt & her husband.

## 2014-05-28 NOTE — Consult Note (Signed)
Reason for Consult: nonreassuring fetal heart tracing; internal hernia of small bowel Referring Physician: Mora Bellman, MD  Barbara Burton is an 39 y.o. female.  HPI: 39 y.o. a [id number] at 48w2dwho presents today with abdominal pain. She states that around 0300 today she started to have pain and possibly contractions. She states that the pain is mostly in the RUQ and radiates to the back. Although occasionally she will feel the pain in her lower abdomen. She then woke up her husband around 0400. She denies any vaginal bleeding or LOF. She reports fetal movement. She states that she has had 4 c-sections and has had gastric bypass.  She states that she had pain similar to this in July. She tried PPI and GI cocktail, and had minimal relief. The pain eventually went away on it's own. She has had her gallbladder removed.  She has been diaphoretic, bradycardic, and fetal heart tracing has been trending into the 90s. [facility] to the or for C section, by Dr. CElly Modena   FINDINGS: Viable female infant in cephalic presentation. Apgars 7 and 8, weight, 5 pounds and 12 ounces. Clear amniotic fluid. Intact placenta, three vessel cord. Normal uterus, fallopian tubes and ovaries bilaterally. Internal hernia with small bowel ischemia noted and general surgery was called. Dr. TMarcello Mooreswent [facility] and [facility] and exploratory laparotomy with lysis of adhesion and reduction of internal hernia.  At this point no SB was resected.  She stabilized and was closed.  [facility]  Past Medical History  Diagnosis Date  . Medical history non-contributory     Past Surgical History  Procedure Laterality Date  Gastric bypass    Cesarean section    Hx of tobacco use    Gallbladder surgery      Family History  Problem Relation Age of Onset  . Diabetes Mother   . Hypertension Father   . Heart disease  Maternal Grandmother     Social History:  reports that she has quit smoking. Her smoking use included Cigarettes. She has never used smokeless tobacco. She reports that she does not drink alcohol or use illicit drugs.  Allergies:  Allergies  Allergen Reactions  . Sulfa Antibiotics Rash    Medications:  Prior to Admission:  Prescriptions prior to admission  Medication Sig Dispense Refill Last Dose  . acetaminophen (TYLENOL) 500 MG tablet Take 1,000 mg by mouth every 4 (four) hours as needed for mild pain or moderate pain.    05/26/2014 at Unknown time  . BIOTIN PO Take 1 tablet by mouth daily.   05/26/2014 at Unknown time  . CALCIUM PO Take 1 tablet by mouth daily.   05/26/2014 at Unknown time  . Cyanocobalamin (VITAMIN B-12 PO) Take 1 tablet by mouth daily.   05/26/2014 at Unknown time  . ferrous sulfate (FERROUSUL) 325 (65 FE) MG tablet Take 1 tablet (325 mg total) by mouth 3 (three) times daily with meals. 90 tablet 1 05/27/2014 at Unknown time  . Prenatal Vit-Fe Fumarate-FA (PRENATAL MULTIVITAMIN) TABS tablet Take 1 tablet by mouth daily at 12 noon.   05/26/2014 at Unknown time  . ACCU-CHEK FASTCLIX LANCETS MISC Inject 1 each into the skin 4 (four) times daily. 648.83 for testing 4 times daily (Patient not taking: Reported on 04/09/2014) 102 each 12 Not Taking  . famotidine (PEPCID) 20 MG tablet Take 1 tablet (20 mg  total) by mouth 2 (two) times daily. (Patient not taking: Reported on 04/09/2014) 60 tablet 3 Not Taking  . glucose blood (ACCU-CHEK SMARTVIEW) test strip One strip 4 times daily. (Patient not taking: Reported on 04/09/2014) 50 each 12 Not Taking   Scheduled: . enoxaparin (LOVENOX) injection  40 mg Subcutaneous Q24H  . fentaNYL       Continuous: . lactated ringers     CHE:NIDPOEUM injection, ondansetron **OR** ondansetron (ZOFRAN) IV Anti-infectives    Start     Dose/Rate Route Frequency Ordered Stop   05/28/14 0945  cefoTEtan (CEFOTAN) 2 g in dextrose 5 % 50 mL IVPB  Status:   Discontinued     2 g100 mL/hr over 30 Minutes Intravenous  Once 05/28/14 0940 05/28/14 1520   05/28/14 0815  ceFAZolin (ANCEF) IVPB 2 g/50 mL premix  Status:  Discontinued     2 g100 mL/hr over 30 Minutes Intravenous On call to O.R. 05/28/14 0800 05/28/14 1358      Results for orders placed or performed during the hospital encounter of 05/28/14 (from the past 48 hour(s))  CBC     Status: Abnormal   Collection Time: 05/28/14  5:25 AM  Result Value Ref Range   WBC 9.9 4.0 - 10.5 K/uL   RBC 5.11 3.87 - 5.11 MIL/uL   Hemoglobin 10.3 (L) 12.0 - 15.0 g/dL   HCT 33.7 (L) 36.0 - 46.0 %   MCV 65.9 (L) 78.0 - 100.0 fL   MCH 20.2 (L) 26.0 - 34.0 pg   MCHC 30.6 30.0 - 36.0 g/dL   RDW 16.2 (H) 11.5 - 15.5 %   Platelets 280 150 - 400 K/uL  Comprehensive metabolic panel     Status: Abnormal   Collection Time: 05/28/14  5:25 AM  Result Value Ref Range   Sodium 136 135 - 145 mmol/L    Comment: Please note change in reference range.   Potassium 3.7 3.5 - 5.1 mmol/L    Comment: Please note change in reference range.   Chloride 108 96 - 112 mEq/L   CO2 18 (L) 19 - 32 mmol/L   Glucose, Bld 124 (H) 70 - 99 mg/dL   BUN 8 6 - 23 mg/dL   Creatinine, Ser 0.37 (L) 0.50 - 1.10 mg/dL   Calcium 9.2 8.4 - 10.5 mg/dL   Total Protein 7.4 6.0 - 8.3 g/dL   Albumin 3.2 (L) 3.5 - 5.2 g/dL   AST 24 0 - 37 U/L   ALT 15 0 - 35 U/L   Alkaline Phosphatase 133 (H) 39 - 117 U/L   Total Bilirubin 0.8 0.3 - 1.2 mg/dL   GFR calc non Af Amer >90 >90 mL/min   GFR calc Af Amer >90 >90 mL/min    Comment: (NOTE) The eGFR has been calculated using the CKD EPI equation. This calculation has not been validated in all clinical situations. eGFR's persistently <90 mL/min signify possible Chronic Kidney Disease.    Anion gap 10 5 - 15  Amylase     Status: Abnormal   Collection Time: 05/28/14  5:25 AM  Result Value Ref Range   Amylase 133 (H) 0 - 105 U/L    Comment: Performed at [facility]  Lipase, blood      Status: None   Collection Time: 05/28/14  5:25 AM  Result Value Ref Range   Lipase 50 11 - 59 U/L    Comment: Performed at [facility]  Type and screen     Status:  None   Collection Time: 05/28/14  5:25 AM  Result Value Ref Range   ABO/RH(D) A POS    Antibody Screen NEG    Sample Expiration 05/31/2014   ABO/Rh     Status: None   Collection Time: 05/28/14  5:25 AM  Result Value Ref Range   ABO/RH(D) A POS   Differential     Status: None   Collection Time: 05/28/14  5:25 AM  Result Value Ref Range   Neutrophils Relative % 64 43 - 77 %   Neutro Abs 6.3 1.7 - 7.7 K/uL   Lymphocytes Relative 28 12 - 46 %   Lymphs Abs 2.7 0.7 - 4.0 K/uL   Monocytes Relative 7 3 - 12 %   Monocytes Absolute 0.6 0.1 - 1.0 K/uL   Eosinophils Relative 1 0 - 5 %   Eosinophils Absolute 0.1 0.0 - 0.7 K/uL   Basophils Relative 0 0 - 1 %   Basophils Absolute 0.0 0.0 - 0.1 K/uL  Lactic acid, plasma     Status: Abnormal   Collection Time: 05/28/14  6:18 AM  Result Value Ref Range   Lactic Acid, Venous 2.6 (H) 0.5 - 2.2 mmol/L    Comment: Performed at [facility]    US Abdomen Complete  05/28/2014   CLINICAL DATA:  Third trimester pregnancy with generalized abdominal pain  EXAM: ULTRASOUND ABDOMEN COMPLETE  COMPARISON:  None.  FINDINGS: Gallbladder: Cholecystectomy  Common bile duct: Diameter: 3 mm  Liver: No focal lesion identified. Within normal limits in parenchymal echogenicity.  IVC: No abnormality visualized.  Pancreas: Visualized portion unremarkable.  Spleen: Size and appearance within normal limits.  Right Kidney: Length: 12 cm. Echogenicity within normal limits. No mass or hydronephrosis visualized.  Left Kidney: Length: 12 cm. Echogenicity within normal limits. No mass or hydronephrosis visualized.  Abdominal aorta: No aneurysm visualized.  Other findings: None.  IMPRESSION: 1. No findings to explain abdominal pain. 2. Cholecystectomy.   Electronically Signed   By: Jorje Guild M.D.    On: 05/28/2014 06:30   US Ob Limited  05/28/2014   OBSTETRICAL ULTRASOUND: This exam was performed within a [facility]. The OB US report was generated in the [facility] system, and faxed to the ordering physician.   This report is available in the [employer]. See the [facility] Obstetric US report via the Image Link.   Review of Systems  Unable to perform ROS: acuity of condition   Blood pressure 102/60, pulse 67, temperature 98.2 F (36.8 C), temperature source Rectal, resp. rate 18, height 5' (1.524 m), weight 89.359 kg (197 lb), last menstrual period 09/09/2013, SpO2 96 %, unknown if currently breastfeeding. Physical Exam  Constitutional: She is oriented to person, place, and time. She appears well-developed and well-nourished. No distress.  HENT:  Head: Normocephalic and atraumatic.  Nose: Nose normal.  Eyes: Conjunctivae and EOM are normal. Right eye exhibits no discharge. Left eye exhibits no discharge.  Neck: Normal range of motion. Neck supple. No JVD present. No tracheal deviation present. No thyromegaly present.  Cardiovascular: Normal rate, regular rhythm, normal heart sounds and intact distal pulses.   No murmur heard. Respiratory: Effort normal and breath sounds normal. No respiratory distress. She has no wheezes. She has no rales. She exhibits no tenderness.  GI: Soft. Bowel sounds are normal. There is tenderness.  She has no bowel sounds large abdominal dressing in place.  Musculoskeletal: She exhibits no edema.  Lymphadenopathy:    She has no  cervical adenopathy.  Neurological: She is alert and oriented to person, place, and time. No cranial nerve deficit.  Skin: Skin is warm and dry. No rash noted. She is not diaphoretic. No erythema. No pallor.  Psychiatric: She has a normal mood and affect. Her behavior is normal. Judgment and thought content normal.    Assessment/Plan: 1.   nonreassuring fetal heart tracing; internal hernia of small bowel 2.  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  S/p CESAREAN SECTION, EXPLORATORY LAPAROTOMY, LYSIS OF ADHESION WITH REDUCTION OF INTERNAL HERNIA, Surgeon(s): Alphonsa Overall, MD Mora Bellman, MD Truett Mainland, DO, Leighton Ruff, MD. [birth year].  Plan:  She is awake and doing well after surgery.  Asking for  Something to drink.  She is transferred to the ICU at [facility] for further treatment and follow up.   Coran Dipaola 05/28/2014, 3:21 PM

## 2014-05-28 NOTE — Anesthesia Postprocedure Evaluation (Signed)
  Anesthesia Post Note  Patient: Barbara Burton  Procedure(s) Performed: Procedure(s) (LRB): CESAREAN SECTION (N/A) EXPLORATORY LAPAROTOMY (N/A) LYSIS OF ADHESION and reduction of internal hernia  (N/A)  Anesthesia type: GA + epi  Patient location: PACU  Post pain: Pain level controlled  Post assessment: Post-op Vital signs reviewed  Last Vitals:  Filed Vitals:   05/28/14 1245  BP:   Pulse:   Temp: 36.8 C  Resp:     Post vital signs: Reviewed  Level of consciousness: sedated  Complications: No apparent anesthesia complications

## 2014-05-28 NOTE — Lactation Note (Signed)
This note was copied from the chart of Barbara Burton. Lactation Consultation Note  Initial consult with this mom of a NICU baby, then 4 hours post partum. Mom came in to the hospital with abdominal pain, and has a history of gastric bypass. The baby was delivered by c-section, and after PACU, [facility] in care of her surgeon. While mom was visiting her baby in NICU, she was on a stretcher, ready to go by care link . I loaned her a DEP, but was not able to get the 30$ loaner feed, since parents did not have any cash on them. I briefly reviewed how to pump, fitted mom with 24 flanges, and did some teaching with dad . They have the nICU booklet on providing EBM. Dad will bring in EBm on a daily basis, if possible. Mom was able to express a drop of colostrum, and I placed it in the baby's mouth.  Mom has wide spaced breasts, with litle breast tissue, and mom has lots of facial hair - a thick beard under her chin.  Patient Name: Barbara Burton ZOXWR'UToday's Date: 05/28/2014 Reason for consult: Initial assessment   Maternal Data Formula Feeding for Exclusion: Yes (baby in NICU) Has patient been taught Hand Expression?: Yes Does the patient have breastfeeding experience prior to this delivery?: Yes  Feeding    LATCH Score/Interventions                      Lactation Tools Discussed/Used WIC Program: Yes [employer]([place] Pump Review: Setup, frequency, and cleaning;Milk Storage Initiated by:: clee rn LC Date initiated:: 05/28/14   Consult Status Consult Status: PRN Follow-up type: In-patient (in NICU)    [name], Kanan Sobek Anne 05/28/2014, 4:15 PM

## 2014-05-28 NOTE — Op Note (Signed)
Barbara Burton, Barbara Burton              ACCOUNT NO.:  [id number]  MEDICAL RECORD NO.:  [id number]  LOCATION:  [facility]                          FACILITY:  [facility]  PHYSICIAN:  Sandria Bales. Ezzard Standing, M.D.  DATE OF BIRTH:  [birth year]  DATE OF PROCEDURE:  05/28/2014                               OPERATIVE REPORT   PREOPERATIVE DIAGNOSIS:  Distressed pregnancy.  History of gastric bypass.  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 Questionable compromised jejunum towards the gastrojejunostomy.  OPERATION PERFORMED:  C-section with exploratory laparotomy, enterolysis of adhesions, and reduction of internal hernia.  PRIMARY SURGEON:  Dr. Maisie Fus.  ASSISTANT:  Sandria Bales. Ezzard Standing, M.D.  ANESTHESIA:  General endotracheal.  (Note:  She was originally an epidural for the C section, then she underwent general anesthesia)  ESTIMATED BLOOD LOSS:  800 mL.  (2/3's of this attributed to the C section)  DRAINS LEFT IN:  None.  INDICATION FOR PROCEDURE:  Ms. Barbara Burton is a 39 year old female who was apparently with her 5th pregnancy.  The infant was having distress, was brought to the operating room for emergency C-section.  Dr. Maisie Fus was contacted because of problems of the bowel that was identified at laparotomy.  The patient has a long midline incision.  I then came to assist Dr. Maisie Fus.  I dictated an additional note, just as a perspective from a Teacher, English as a foreign language.  OPERATIVE NOTE:  The patient is in room #1 at [facility] under  general anesthesia.  At the time of my arrival, she had a full midline laparotomy incision.  By reconstruction, apparently the C-section/surgery started at 8:28 with Dr. Catalina Antigua and her assistant Dr. Adrian Blackwater.  The baby boy was delivered at 8:38.  Dr. Maisie Fus arrived at 8:45.  Dr. 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  She had reduced the internal hernia.  An in the mean time, the bowel is now pink and peristalsising.  I arrived at about 10:05, so Dr. Maisie Fus had been at work for about an hour and 20 minutes at that time.  The jejunum near the gastrojejunal anastomosis was somewhat ischemic when she first did her disssection.  But in the meantime, the bowel has pinked up considerably.  In my exploration, the patient's liver looks okay.  She had a full midline incision.  We could identify the gastric pouch.  We identified the gastrojejunal anastomosis.  The mall bowel from the gastrojejunostomy distally about 10 inches was minimally dusky.  The proximal jejunum in question was about 10 to 12 inches in length.  At that point is where it looked like the mesentery had been taken to the edge of the small bowel, so what blood supply the bowel was getting was probably off mucosal blood supply.  I ran the small bowel distally to the jejunojejunal anastomosis, I looked at the biliary limb which looked okay; I ran the bowel to the terminal ileum.  All the remaining small bowel looked okay.  The proximal bowel that had been dusky was peristalsising and looked  viable at this  time.  I thought the best course for the patient was to do nothing further.  We did [facility], so I could not do an endoscopic evaluation of the pouch or small bowel.  Also, the operating room was not prepared for a major upper abdominal surgery; and again at this time, I think, the bowel survived enough that it will hopefully not need surgery.  I think, the best chance was for us to close the patient, [facility], so if she needs further intervention either with endoscopy, surgery, or both, we will have access to that facility.  So, at this point, we irrigated the abdomen out with 1 L of saline.  I reinspected the uterus.  Dr. Jolayne Pantheronstant came back in the operating room.  I  discussed our findings with her.  She thought that the uterus was okay.  I did close Petersen's defect loosely with 2-0 Vicryl sutures.  This was tacked to the omentum and the transverse colon and I tried to make sure I did not compromise any further blood supply to gastric loop of jejunum.  I then closed the abdomen with 2 running #1 PDS sutures with about every 3rd or 4th stitch I placed an interrupted #1 PDS suture.  The wound was then irrigated with 1 L of saline.  The skin was closed with skin stitch of interrupted #2-0 PDS suture and then staples.  The patient tolerated the procedure well.  The plan is to extubate her here, transfer her [facility] ICU [facility] for further observation and then make decisions about if any further intervention.  The sponge and needle count were correct and she has tolerated the operation well.  I spent 30 minutes going over the operative findings with her husband, Barbara DarterSteve Kuhar [phone]).   Sandria Balesavid H. Ezzard StandingNewman, M.D., [facility], scribe for Epic   DHN/MEDQ  D:  05/28/2014  T:  05/28/2014  Job:  [id number]  cc:   Catalina AntiguaPeggy Constant, MD

## 2014-05-28 NOTE — Progress Notes (Signed)
Responded to referral from [facility].  Introduced service and provided brief support with pt on arrival.

## 2014-05-28 NOTE — Transfer of Care (Signed)
Immediate Anesthesia Transfer of Care Note  Patient: Barbara Burton  Procedure(s) Performed: Procedure(s): CESAREAN SECTION (N/A) EXPLORATORY LAPAROTOMY (N/A) LYSIS OF ADHESION and reduction of internal hernia  (N/A)  Patient Location: PACU  Anesthesia Type:General and Epidural  Level of Consciousness: awake, alert , oriented and patient cooperative  Airway & Oxygen Therapy: Patient Spontanous Breathing and Patient connected to nasal cannula oxygen  Post-op Assessment: Report given to PACU RN and Post -op Vital signs reviewed and stable  Post vital signs: Reviewed and stable  Complications: No apparent anesthesia complications

## 2014-05-28 NOTE — Anesthesia Preprocedure Evaluation (Deleted)
Anesthesia Evaluation  Patient identified by MRN, date of birth, ID band Patient awake    Reviewed: Allergy & Precautions, H&P , NPO status , Patient's Chart, lab work & pertinent test results  Airway Mallampati: II       Dental   Pulmonary former smoker,  breath sounds clear to auscultation        Cardiovascular Exercise Tolerance: Good Rhythm:regular Rate:Normal     Neuro/Psych    GI/Hepatic   Endo/Other    Renal/GU      Musculoskeletal   Abdominal   Peds  Hematology  (+) anemia ,   Anesthesia Other Findings   Reproductive/Obstetrics (+) Pregnancy                             Anesthesia Physical Anesthesia Plan  ASA: II and emergent  Anesthesia Plan: Spinal   Post-op Pain Management:    Induction:   Airway Management Planned:   Additional Equipment:   Intra-op Plan:   Post-operative Plan:   Informed Consent: I have reviewed the patients History and Physical, chart, labs and discussed the procedure including the risks, benefits and alternatives for the proposed anesthesia with the patient or authorized representative who has indicated his/her understanding and acceptance.     Plan Discussed with: Anesthesiologist, CRNA and Surgeon  Anesthesia Plan Comments:         Anesthesia Quick Evaluation

## 2014-05-28 NOTE — MAU Provider Note (Signed)
History     CSN: [id number]  Arrival date and time: 05/28/14 4098   First Provider Initiated Contact with Patient 05/28/14 0532      No chief complaint on file.  HPI  Barbara Burton is a 39 y.o. a G5P4004 at [redacted]w[redacted]d who presents today with abdominal pain. She states that around 0300 today she started to have pain and possibly contractions. She states that the pain is mostly in the RUQ and radiates to the back. Although occasionally she will feel the pain in her lower abdomen. She then woke up her husband around 0400. She denies any vaginal bleeding or LOF. She reports fetal movement. She states that she has had 4 c-sections and has had gastric bypass.   She states that she had pain similar to this in July. She tried PPI and GI cocktail, and had minimal relief. The pain eventually went away on it's own. She has had her gallbladder removed.   Past Medical History  Diagnosis Date  . Medical history non-contributory     Past Surgical History  Procedure Laterality Date  . Gastric bypass    . Cesarean section    . Gallbladder surgery      Family History  Problem Relation Age of Onset  . Diabetes Mother   . Hypertension Father   . Heart disease Maternal Grandmother     History  Substance Use Topics  . Smoking status: Former Smoker    Types: Cigarettes  . Smokeless tobacco: Never Used  . Alcohol Use: No    Allergies:  Allergies  Allergen Reactions  . Sulfa Antibiotics Rash    Prescriptions prior to admission  Medication Sig Dispense Refill Last Dose  . acetaminophen (TYLENOL) 500 MG tablet Take 1,000 mg by mouth every 4 (four) hours as needed for mild pain or moderate pain.    05/27/2014 at Unknown time  . BIOTIN PO Take 1 tablet by mouth daily.   05/27/2014 at Unknown time  . CALCIUM PO Take 1 tablet by mouth daily.   05/27/2014 at Unknown time  . Cyanocobalamin (VITAMIN B-12 PO) Take 1 tablet by mouth daily.   05/27/2014 at Unknown time  . ferrous sulfate (FERROUSUL)  325 (65 FE) MG tablet Take 1 tablet (325 mg total) by mouth 3 (three) times daily with meals. 90 tablet 1 05/27/2014 at Unknown time  . Prenatal Vit-Fe Fumarate-FA (PRENATAL MULTIVITAMIN) TABS tablet Take 1 tablet by mouth daily at 12 noon.   05/27/2014 at Unknown time  . ACCU-CHEK FASTCLIX LANCETS MISC Inject 1 each into the skin 4 (four) times daily. 119.14 for testing 4 times daily (Patient not taking: Reported on 04/09/2014) 102 each 12 Not Taking  . famotidine (PEPCID) 20 MG tablet Take 1 tablet (20 mg total) by mouth 2 (two) times daily. (Patient not taking: Reported on 04/09/2014) 60 tablet 3 Not Taking  . glucose blood (ACCU-CHEK SMARTVIEW) test strip One strip 4 times daily. (Patient not taking: Reported on 04/09/2014) 50 each 12 Not Taking    ROS Physical Exam   Last menstrual period 09/09/2013.  Physical Exam  Nursing note and vitals reviewed. Constitutional: She is oriented to person, place, and time. She appears well-developed and well-nourished. She appears distressed (patient is screaming in pain ).  Cardiovascular:  bradycardic at times.   Respiratory: Effort normal.  GI: Soft. There is no tenderness. There is no rebound.  Genitourinary:  Cervix is closed/thick/firm/-2   Neurological: She is alert and oriented to person, place, and time.  Skin: Skin is warm and dry.  Psychiatric: She has a normal mood and affect.   FHT: 135, moderate  Toco: q 4 min contractions  0600: FHR now 110's with moderate variability  MAU Course  Procedures   Results for orders placed or performed during the hospital encounter of 05/28/14 (from the past 24 hour(s))  CBC     Status: Abnormal   Collection Time: 05/28/14  5:25 AM  Result Value Ref Range   WBC 9.9 4.0 - 10.5 K/uL   RBC 5.11 3.87 - 5.11 MIL/uL   Hemoglobin 10.3 (L) 12.0 - 15.0 g/dL   HCT 16.133.7 (L) 09.636.0 - 04.546.0 %   MCV 65.9 (L) 78.0 - 100.0 fL   MCH 20.2 (L) 26.0 - 34.0 pg   MCHC 30.6 30.0 - 36.0 g/dL   RDW 40.916.2 (H) 81.111.5 - 91.415.5 %    Platelets 280 150 - 400 K/uL  Comprehensive metabolic panel     Status: Abnormal   Collection Time: 05/28/14  5:25 AM  Result Value Ref Range   Sodium 136 135 - 145 mmol/L   Potassium 3.7 3.5 - 5.1 mmol/L   Chloride 108 96 - 112 mEq/L   CO2 18 (L) 19 - 32 mmol/L   Glucose, Bld 124 (H) 70 - 99 mg/dL   BUN 8 6 - 23 mg/dL   Creatinine, Ser 7.820.37 (L) 0.50 - 1.10 mg/dL   Calcium 9.2 8.4 - 95.610.5 mg/dL   Total Protein 7.4 6.0 - 8.3 g/dL   Albumin 3.2 (L) 3.5 - 5.2 g/dL   AST 24 0 - 37 U/L   ALT 15 0 - 35 U/L   Alkaline Phosphatase 133 (H) 39 - 117 U/L   Total Bilirubin 0.8 0.3 - 1.2 mg/dL   GFR calc non Af Amer >90 >90 mL/min   GFR calc Af Amer >90 >90 mL/min   Anion gap 10 5 - 15  Type and screen     Status: None   Collection Time: 05/28/14  5:25 AM  Result Value Ref Range   ABO/RH(D) A POS    Antibody Screen NEG    Sample Expiration 05/31/2014   Differential     Status: None   Collection Time: 05/28/14  5:25 AM  Result Value Ref Range   Neutrophils Relative % 64 43 - 77 %   Neutro Abs 6.3 1.7 - 7.7 K/uL   Lymphocytes Relative 28 12 - 46 %   Lymphs Abs 2.7 0.7 - 4.0 K/uL   Monocytes Relative 7 3 - 12 %   Monocytes Absolute 0.6 0.1 - 1.0 K/uL   Eosinophils Relative 1 0 - 5 %   Eosinophils Absolute 0.1 0.0 - 0.7 K/uL   Basophils Relative 0 0 - 1 %   Basophils Absolute 0.0 0.0 - 0.1 K/uL    [place] Abdomen Complete  05/28/2014   CLINICAL DATA:  Third trimester pregnancy with generalized abdominal pain  EXAM: ULTRASOUND ABDOMEN COMPLETE  COMPARISON:  None.  FINDINGS: Gallbladder: Cholecystectomy  Common bile duct: Diameter: 3 mm  Liver: No focal lesion identified. Within normal limits in parenchymal echogenicity.  IVC: No abnormality visualized.  Pancreas: Visualized portion unremarkable.  Spleen: Size and appearance within normal limits.  Right Kidney: Length: 12 cm. Echogenicity within normal limits. No mass or hydronephrosis visualized.  Left Kidney: Length: 12 cm. Echogenicity within  normal limits. No mass or hydronephrosis visualized.  Abdominal aorta: No aneurysm visualized.  Other findings: None.  IMPRESSION: 1. No findings to explain  abdominal pain. 2. Cholecystectomy.   Electronically Signed   By: Tiburcio Pea M.D.   On: 05/28/2014 06:30    0600: Dr. Adrian Blackwater notified of patient status, and he will come see the patient.   0604: Dr. Adrian Blackwater on the unit.  1610: OB US is normal. See media tab for scanned report  0732: Dr. Adrian Blackwater notified of FHTs at this time, and patient's current status.  9604: Dr. Adrian Blackwater on the unit. The Plan is to take patient to the OR for STAT c-section.    Assessment and Plan  Generalized abdominal pain in pregnancy  Unknown etiology To OR for c-section   Tawnya Crook 05/28/2014, 5:38 AM

## 2014-05-28 NOTE — MAU Note (Signed)
Dr Judd in to see pt 

## 2014-05-28 NOTE — Anesthesia Preprocedure Evaluation (Addendum)
Anesthesia Evaluation  Patient identified by MRN, date of birth, ID band Patient awake    Reviewed: Allergy & Precautions, H&P , NPO status , Patient's Chart, lab work & pertinent test results  History of Anesthesia Complications Negative for: history of anesthetic complications  Airway Mallampati: III       Dental  (+) Dental Advisory Given   Pulmonary former smoker,  breath sounds clear to auscultation        Cardiovascular Exercise Tolerance: Good negative cardio ROS  Rhythm:regular Rate:Normal     Neuro/Psych negative neurological ROS  negative psych ROS   GI/Hepatic negative GI ROS, Neg liver ROS,   Endo/Other  Morbid obesityHx of gastric bypass, hx of diabetes pre bypass, denies any medical history since   Renal/GU negative Renal ROS  negative genitourinary   Musculoskeletal negative musculoskeletal ROS (+)   Abdominal   Peds negative pediatric ROS (+)  Hematology  (+) anemia ,   Anesthesia Other Findings   Reproductive/Obstetrics (+) Pregnancy C/S x 4, this is 5th repeat C/S                          Anesthesia Physical Anesthesia Plan  ASA: III and emergent  Anesthesia Plan: Epidural and Spinal   Post-op Pain Management:    Induction:   Airway Management Planned:   Additional Equipment:   Intra-op Plan:   Post-operative Plan:   Informed Consent: I have reviewed the patients History and Physical, chart, labs and discussed the procedure including the risks, benefits and alternatives for the proposed anesthesia with the patient or authorized representative who has indicated his/her understanding and acceptance.   Dental advisory given  Plan Discussed with: Anesthesiologist, CRNA and Surgeon  Anesthesia Plan Comments: (Discussed possible need for general anesthesia given possible intraabdominal process, repeat C/S x5, or if escalation to STAT)      Anesthesia  Quick Evaluation

## 2014-05-28 NOTE — MAU Note (Signed)
Pt states that she is having abdominal pain that is not labor. Pt has had gastric bypass surgery in 2006 and since has had the same abdominal pain that she is experiencing today. Denies vaginal bleeding, denies contractions, denies SROM/LOF. Positive fetal movement. No other risk factors for pregnancy per patient.

## 2014-05-28 NOTE — MAU Note (Signed)
O2 on @ 2L/min by [place].

## 2014-05-28 NOTE — Anesthesia Procedure Notes (Addendum)
Epidural Patient location during procedure: OB Start time: 05/28/2014 8:20 AM  Staffing Anesthesiologist: Brayton CavesJACKSON, FREEMAN Performed by: anesthesiologist   Preanesthetic Checklist Completed: patient identified, site marked, surgical consent, pre-op evaluation, timeout performed, IV checked, risks and benefits discussed and monitors and equipment checked  Epidural Patient position: sitting Prep: site prepped and draped and DuraPrep Patient monitoring: continuous pulse ox and blood pressure Approach: midline Location: L3-L4 Injection technique: LOR air  Needle:  Needle type: Tuohy  Needle gauge: 17 G Needle length: 9 cm and 9 Needle insertion depth: 7 cm Catheter type: closed end flexible Catheter size: 19 Gauge Catheter at skin depth: 10 cm Test dose: negative  Assessment Events: blood not aspirated, injection not painful, no injection resistance, negative IV test and no paresthesia  Additional Notes Patient identified.  Risk benefits discussed including failed block, incomplete pain control, headache, nerve damage, paralysis, blood pressure changes, nausea, vomiting, reactions to medication both toxic or allergic, and postpartum back pain.  Patient expressed understanding and wished to proceed.  All questions were answered.  Sterile technique used throughout procedure and epidural site dressed with sterile barrier dressing. No paresthesia or other complications noted.The patient did not experience any signs of intravascular injection such as tinnitus or metallic taste in mouth nor signs of intrathecal spread such as rapid motor block. Please see nursing notes for vital signs.   Procedure Name: Intubation Date/Time: 05/28/2014 9:38 AM Performed by: Yolonda KidaARVER, Deen Deguia L Pre-anesthesia Checklist: Patient identified, Emergency Drugs available, Suction available and Patient being monitored Patient Re-evaluated:Patient Re-evaluated prior to inductionOxygen Delivery Method: Circle system  utilized Preoxygenation: Pre-oxygenation with 100% oxygen Intubation Type: IV induction, Rapid sequence and Cricoid Pressure applied Laryngoscope Size: Glidescope Grade View: Grade I Tube type: Oral Tube size: 7.0 mm Number of attempts: 1 Placement Confirmation: ETT inserted through vocal cords under direct vision,  positive ETCO2,  CO2 detector and breath sounds checked- equal and bilateral Secured at: 20 cm Tube secured with: Tape Dental Injury: Teeth and Oropharynx as per pre-operative assessment

## 2014-05-28 NOTE — Progress Notes (Signed)
   05/28/14 1500  Clinical Encounter Type  Visited With Health care provider Raquel Sarna(Lorinda Shaw, RN, house coverage)  Visit Type Initial  Referral From (AC/house coverage)  Stress Factors  Patient Stress Factors Health changes;Major life changes  Family Stress Factors Major life changes   [facility].  Plan to visit with pt/family tomorrow when I am on campus tomorrow.  Visited baby in NICU today, but no family present.  Please also page as needs arise.  Thank you.  Chaplain Rush BarerLisa Pax Reasoner, [place] pager [phone] [facility] pager [phone]

## 2014-05-28 NOTE — Lactation Note (Signed)
This note was copied from the chart of Barbara Burton. Lactation Consultation Note    Follow up note about this mom at [facility], at extension  [phone]. I spoke to mom's nurse about pumping, labeling and storage. They have a cooler with ice packs at the mom's bedside. She has pumped 3 mls once so far. I explained how that was a very good amount. I advised mom's nurse to reference the NICU booklet on providing EBm as needed, and to call lactation for any questions/concerns. i also asked that she send a not with dad as to what medications she is on, Mom has been on some fentanyl for pain. I also said if they could label her milk with meds, to do so, if she could fit it on the label.   Patient Name: Barbara Burton [id number]'[id number]'s Date: 05/28/2014 Reason for consult: Follow-up assessment;NICU baby;Other (Comment) ([facility] for abdominal pain and possible surgery)   Maternal Data Formula Feeding for Exclusion: Yes (baby in NICU) Has patient been taught Hand Expression?: Yes Does the patient have breastfeeding experience prior to this delivery?: Yes  Feeding    LATCH Score/Interventions                      Lactation Tools Discussed/Used WIC Program: Yes [employer] Pump Review: Setup, frequency, and cleaning;Milk Storage Initiated by:: clee rn LC Date initiated:: 05/28/14   Consult Status Consult Status: PRN Follow-up type: In-patient (NICU)    Barbara Burton, Barbara Burton 05/28/2014, 6:20 PM

## 2014-05-28 NOTE — OR Nursing (Signed)
Dr Maisie Fushomas paged general surgeon at [phone]. Dr Daphine DeutscherMartin returned page at [phone] to [phone]. [phone] Dr Daphine DeutscherMartin called [phone] states Dr. Ezzard StandingNewman to come and assist Dr. Maisie Fushomas.

## 2014-05-29 ENCOUNTER — Encounter (HOSPITAL_COMMUNITY): Payer: Self-pay | Admitting: Obstetrics and Gynecology

## 2014-05-29 ENCOUNTER — Encounter (HOSPITAL_COMMUNITY): Admission: AD | Disposition: A | Discharge status: Home / Self Care | Payer: Self-pay | Source: Ambulatory Visit

## 2014-05-29 HISTORY — PX: ESOPHAGOGASTRODUODENOSCOPY: SHX5428

## 2014-05-29 LAB — CBC
HCT: 25.6 % — ABNORMAL LOW (ref 36.0–46.0)
HEMATOCRIT: 25.1 % — AB (ref 36.0–46.0)
HEMOGLOBIN: 7.6 g/dL — AB (ref 12.0–15.0)
Hemoglobin: 7.5 g/dL — ABNORMAL LOW (ref 12.0–15.0)
MCH: 20.4 pg — AB (ref 26.0–34.0)
MCH: 19.9 pg — ABNORMAL LOW (ref 26.0–34.0)
MCHC: 30.3 g/dL (ref 30.0–36.0)
MCHC: 29.3 g/dL — ABNORMAL LOW (ref 30.0–36.0)
MCV: 67.3 fL — AB (ref 78.0–100.0)
MCV: 68.1 fL — AB (ref 78.0–100.0)
PLATELETS: 227 10*3/uL (ref 150–400)
Platelets: 222 10*3/uL (ref 150–400)
RBC: 3.73 MIL/uL — ABNORMAL LOW (ref 3.87–5.11)
RBC: 3.76 MIL/uL — ABNORMAL LOW (ref 3.87–5.11)
RDW: 16.2 % — ABNORMAL HIGH (ref 11.5–15.5)
RDW: 16.3 % — AB (ref 11.5–15.5)
WBC: 14.1 10*3/uL — ABNORMAL HIGH (ref 4.0–10.5)
WBC: 13.5 10*3/uL — AB (ref 4.0–10.5)

## 2014-05-29 LAB — BASIC METABOLIC PANEL
Anion gap: 5 (ref 5–15)
BUN: 12 mg/dL (ref 6–23)
CO2: 24 mmol/L (ref 19–32)
Calcium: 7.9 mg/dL — ABNORMAL LOW (ref 8.4–10.5)
Chloride: 105 mEq/L (ref 96–112)
Creatinine, Ser: 0.43 mg/dL — ABNORMAL LOW (ref 0.50–1.10)
GFR calc Af Amer: >90 mL/min (ref >90)
Glucose, Bld: 75 mg/dL (ref 70–99)
POTASSIUM: 3.4 mmol/L — AB (ref 3.5–5.1)
SODIUM: 134 mmol/L — AB (ref 135–145)

## 2014-05-29 LAB — LACTIC ACID, PLASMA: Lactic Acid, Venous: 1.1 mmol/L (ref 0.5–2.2)

## 2014-05-29 LAB — MAGNESIUM: Magnesium: 1.4 mg/dL — ABNORMAL LOW (ref 1.5–2.5)

## 2014-05-29 SURGERY — EGD (ESOPHAGOGASTRODUODENOSCOPY)
Anesthesia: Moderate Sedation

## 2014-05-29 MED ORDER — HYDROMORPHONE HCL 1 MG/ML IJ SOLN
1.0000 mg | Freq: Once | INTRAMUSCULAR | Status: AC
  Administered 2014-05-29: 1 mg via INTRAVENOUS
  Filled 2014-05-29: qty 1

## 2014-05-29 MED ORDER — OXYCODONE-ACETAMINOPHEN 5-325 MG PO TABS
1.0000 | ORAL_TABLET | ORAL | Status: DC | PRN
  Administered 2014-05-29 – 2014-06-02 (×19): 1 via ORAL
  Filled 2014-05-29 (×19): qty 1

## 2014-05-29 MED ORDER — FENTANYL CITRATE 0.05 MG/ML IJ SOLN
INTRAMUSCULAR | Status: AC
  Filled 2014-05-29: qty 2

## 2014-05-29 MED ORDER — FENTANYL CITRATE 0.05 MG/ML IJ SOLN
INTRAMUSCULAR | Status: DC | PRN
  Administered 2014-05-29: 25 ug via INTRAVENOUS

## 2014-05-29 MED ORDER — MAGNESIUM SULFATE 4 GM/100ML IV SOLN
4.0000 g | Freq: Once | INTRAVENOUS | Status: AC
  Administered 2014-05-29: 4 g via INTRAVENOUS
  Filled 2014-05-29: qty 100

## 2014-05-29 MED ORDER — HYDROMORPHONE HCL 1 MG/ML IJ SOLN
0.5000 mg | INTRAMUSCULAR | Status: DC | PRN
  Administered 2014-05-29 (×3): 1 mg via INTRAVENOUS
  Filled 2014-05-29 (×3): qty 1

## 2014-05-29 MED ORDER — MIDAZOLAM HCL 10 MG/2ML IJ SOLN
INTRAMUSCULAR | Status: DC | PRN
  Administered 2014-05-29: 2 mg via INTRAVENOUS

## 2014-05-29 MED ORDER — MIDAZOLAM HCL 2 MG/2ML IJ SOLN
INTRAMUSCULAR | Status: AC
  Filled 2014-05-29: qty 6

## 2014-05-29 MED ORDER — BUTAMBEN-TETRACAINE-BENZOCAINE 2-2-14 % EX AERO
INHALATION_SPRAY | CUTANEOUS | Status: DC | PRN
  Administered 2014-05-29: 2 via TOPICAL

## 2014-05-29 MED ORDER — POTASSIUM CHLORIDE IN NACL 40-0.9 MEQ/L-% IV SOLN
INTRAVENOUS | Status: DC
  Administered 2014-05-29 – 2014-05-30 (×2): 100 mL/h via INTRAVENOUS
  Administered 2014-05-30 – 2014-05-31 (×2): 50 mL/h via INTRAVENOUS
  Filled 2014-05-29 (×8): qty 1000

## 2014-05-29 NOTE — Op Note (Addendum)
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  ANESTHESIA:   Fentanyl  25 mcg   Versed 2 mg  INDICATIONS FOR PROCEDURE:  Barbara Burton is a 39 y.o. (DOB: [birth year])  white  female whose primary care physician is Default, Provider, MD.  Dr. Chana BodeP. Constant is her OB doctor.  She had an emergency C section yesterday (05/28/2014) [facility].  At the same time, she had an exploration for an internal hernia by Dr. Clovis PuA. Thomas at [facility].  There was a question of ischemia at the jejunum near the gastrojejunostomy.  [facility].     She is in the ICU and I plan an upper endoscopy to evaluate the small bowel for ischemia.  The patient [place].  I do not have any details of the operation.   The indications and risks of the endoscopy were explained to the patient.  The risks include, but are not limited to, perforation, bleeding, or injury to the bowel.  If balloon dilatation is needed, the risk of perforation is higher.  PROCEDURE:  The patient was in room 1231 in the ICU.  The patient was monitored with a pulse oximetry, BP cuff, and EKG.  The patient has nasal O2 flowing during the procedure.   The back of the throat was anesthestized with Ceticaine x 3.  The patient was positioned in the left lateral decubitus position.  The patient was given Fentanyl and Versed.  A flexible pediatric Pentax endoscope was passed down the throat without difficulty.   Findings include:   Esophagus:   Normal   GE junction at:  33 cm   Stomach pouch: Normal   Gastrojejunal anastomosis:   39 cm   Efferent jejunal limb:  I only went about 5 cm in the small  bowel, but the mucosa looks viable and pink.   CLO test:  Not done  PLAN:   Photos taken.  She'll remain in the ICU for now and we will start feeding her.  Ovidio Kinavid Saraya Tirey, MD, [facility] Pager: [phone] Office phone:  [phone]

## 2014-05-29 NOTE — Progress Notes (Signed)
Patient refuses to ambulate.  Encouraged ambulation.  Agreed to get to chair after pumping (lactation).

## 2014-05-29 NOTE — Progress Notes (Signed)
Patients husband informed patient that her infant [facility]. Patient is visible upset and asking that MD on call be contacted to answer questions regarding discharge or [facility]. Patient expresses concerns about breast feeding and caring for her son while she is hospitalized. Efforts to reassure the patient were unsuccessful. Paged [facility] MD on call to help answer questions. Bosie HelperS. Jamare Vanatta, RN

## 2014-05-29 NOTE — Progress Notes (Addendum)
Subjective: Postpartum Day 1: Cesarean Delivery, exploratory laparotomy with lysis of adhesion with reduction of internal hernia.  Patient reports incisional pain.  Patient NPO for endoscopy later today.  Has foley catheter draining dark urine.  Mild lochia.   Objective: Vital signs in last 24 hours: Temp:  [97.5 F (36.4 C)-98.9 F (37.2 C)] 98.9 F (37.2 C) (01/20 0800) Pulse Rate:  [55-101] 67 (01/20 0900) Resp:  [12-31] 18 (01/20 0900) BP: (86-132)/(41-88) 116/54 mmHg (01/20 0900) SpO2:  [96 %-100 %] 97 % (01/20 0900) Weight:  [192 lb 14.4 oz (87.5 kg)-195 lb 1.7 oz (88.5 kg)] 195 lb 1.7 oz (88.5 kg) (01/20 0455)  Physical Exam:  General: alert, cooperative and no distress Lochia: appropriate Uterine Fundus: firm Incision: no significant drainage DVT Evaluation: No evidence of DVT seen on physical exam.  SCDs on   Recent Labs  05/28/14 1825 05/29/14 0408  HGB 9.0* 7.6*  HCT 30.5* 25.1*    Assessment/Plan: POD #1 from Cesarean section, ex-lap with lysis of adhesion on reduction of internal hernia H/o RNY gastric bypass  Doing well postoperatively.  Continue postpartum care - pain control, pumping q2-3 hrs.   Drop in Hg within expected due to blood loss during surgery.  Patient not symptomatic at this point - agree with holding transfusion at this time. EGD later today per gen surgery Continue current care.  Candelaria CelesteJacob Lea Baine, DO Attending Physician Faculty Practice, [facility] For concerns, please call the attending on call: [phone] 05/29/2014 9:44 AM

## 2014-05-29 NOTE — Progress Notes (Signed)
1 Day Post-Op  Subjective: She looks good and tolerated dressing removal. Dr. Ezzard Standing is in and he plans endoscopy this AM.   Objective: Vital signs in last 24 hours: Temp:  [97.5 F (36.4 C)-98.4 F (36.9 C)] 98.2 F (36.8 C) (01/20 0455) Pulse Rate:  [55-101] 82 (01/20 0600) Resp:  [12-31] 17 (01/20 0600) BP: (86-132)/(41-88) 109/61 mmHg (01/20 0600) SpO2:  [96 %-100 %] 98 % (01/20 0600) Weight:  [87.5 kg (192 lb 14.4 oz)-88.5 kg (195 lb 1.7 oz)] 88.5 kg (195 lb 1.7 oz) (01/20 0455)  1100 blood loss recorded on I/o Afebrile, VSS, BP down some last PM H/h is sow from 10.333.7 to 7.6/25.1 K+ 3.4 Lactate is normal  Intake/Output from previous day: 01/19 0701 - 01/20 0700 In: 7131.3 [I.V.:7131.3] Out: 2175 [Urine:1075; Blood:1100] Intake/Output this shift:    General appearance: alert, cooperative, no distress and really sore and tender. Resp: clear to auscultation bilaterally GI: soft, dressing clean and I took it down replace with smaller dressing.  + BS,  present, and the wound looks ok.  Lab Results:   Recent Labs  05/28/14 1825 05/29/14 0408  WBC 14.9* 14.1*  HGB 9.0* 7.6*  HCT 30.5* 25.1*  PLT 264 222    BMET  Recent Labs  05/28/14 0525 05/29/14 0408  NA 136 134*  K 3.7 3.4*  CL 108 105  CO2 18* 24  GLUCOSE 124* 75  BUN 8 12  CREATININE 0.37* 0.43*  CALCIUM 9.2 7.9*   PT/INR No results for input(s): LABPROT, INR in the last 72 hours.   Recent Labs Lab 05/28/14 0525  AST 24  ALT 15  ALKPHOS 133*  BILITOT 0.8  PROT 7.4  ALBUMIN 3.2*     Lipase     Component Value Date/Time   LIPASE 50 05/28/2014 0525     Studies/Results: US Abdomen Complete  05/28/2014   CLINICAL DATA:  Third trimester pregnancy with generalized abdominal pain  EXAM: ULTRASOUND ABDOMEN COMPLETE  COMPARISON:  None.  FINDINGS: Gallbladder: Cholecystectomy  Common bile duct: Diameter: 3 mm  Liver: No focal lesion identified. Within normal limits in parenchymal  echogenicity.  IVC: No abnormality visualized.  Pancreas: Visualized portion unremarkable.  Spleen: Size and appearance within normal limits.  Right Kidney: Length: 12 cm. Echogenicity within normal limits. No mass or hydronephrosis visualized.  Left Kidney: Length: 12 cm. Echogenicity within normal limits. No mass or hydronephrosis visualized.  Abdominal aorta: No aneurysm visualized.  Other findings: None.  IMPRESSION: 1. No findings to explain abdominal pain. 2. Cholecystectomy.   Electronically Signed   By: Tiburcio Pea M.D.   On: 05/28/2014 06:30   US Ob Limited  05/28/2014   OBSTETRICAL ULTRASOUND: This exam was performed within a [facility]. The OB US report was generated in the AS system, and faxed to the ordering physician.   This report is available in the [employer]. See the [facility] Obstetric US report via the Image Link.   Medications: . acetaminophen  1,000 mg Oral 4 times per day  . enoxaparin (LOVENOX) injection  40 mg Subcutaneous Q24H  . scopolamine  1 patch Transdermal Once    Assessment/Plan 1. nonreassuring fetal heart tracing; internal hernia of small bowel 2. 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 S/p CESAREAN SECTION, EXPLORATORY LAPAROTOMY, LYSIS OF ADHESION WITH REDUCTION OF INTERNAL HERNIA, Surgeon(s): Ovidio Kin, MD Catalina Antigua, MD Levie Heritage, DO, Romie Levee, MD. 05/28/14.    Plan:  She is stable this AM, Dr Ezzard StandingNewman is going to do EGD today.  We are not going to transfuse at this point but watch it closely.  She had a type and screen already.     LOS: 1 day    Armandina Iman 05/29/2014

## 2014-05-29 NOTE — Progress Notes (Signed)
Patient refused to ambulate or get into the bedside chair with assistance.  Education reinforced concerning the importance of mobilization.  The patient verbalized understanding.

## 2014-05-30 ENCOUNTER — Encounter (HOSPITAL_COMMUNITY): Payer: Self-pay | Admitting: Surgery

## 2014-05-30 LAB — BASIC METABOLIC PANEL
Anion gap: 9 (ref 5–15)
BUN: 7 mg/dL (ref 6–23)
CHLORIDE: 103 meq/L (ref 96–112)
CO2: 21 mmol/L (ref 19–32)
Calcium: 7.9 mg/dL — ABNORMAL LOW (ref 8.4–10.5)
Creatinine, Ser: 0.46 mg/dL — ABNORMAL LOW (ref 0.50–1.10)
GFR calc Af Amer: >90 mL/min (ref >90)
GFR calc non Af Amer: >90 mL/min (ref >90)
Glucose, Bld: 63 mg/dL — ABNORMAL LOW (ref 70–99)
Potassium: 4 mmol/L (ref 3.5–5.1)
Sodium: 133 mmol/L — ABNORMAL LOW (ref 135–145)

## 2014-05-30 LAB — CBC
HCT: 23.9 % — ABNORMAL LOW (ref 36.0–46.0)
Hemoglobin: 7.1 g/dL — ABNORMAL LOW (ref 12.0–15.0)
MCH: 20.1 pg — AB (ref 26.0–34.0)
MCHC: 29.7 g/dL — AB (ref 30.0–36.0)
MCV: 67.7 fL — ABNORMAL LOW (ref 78.0–100.0)
PLATELETS: 260 10*3/uL (ref 150–400)
RBC: 3.53 MIL/uL — AB (ref 3.87–5.11)
RDW: 16.3 % — ABNORMAL HIGH (ref 11.5–15.5)
WBC: 15.1 10*3/uL — AB (ref 4.0–10.5)

## 2014-05-30 MED ORDER — COMPLETENATE 29-1 MG PO CHEW
1.0000 | CHEWABLE_TABLET | Freq: Every day | ORAL | Status: DC
  Filled 2014-05-30: qty 1

## 2014-05-30 MED ORDER — PRENATAL MULTIVITAMIN CH
1.0000 | ORAL_TABLET | Freq: Every day | ORAL | Status: DC
  Administered 2014-05-30 – 2014-06-02 (×4): 1 via ORAL
  Filled 2014-05-30 (×4): qty 1

## 2014-05-30 NOTE — Progress Notes (Signed)
POD 2 Reversal of internal hernia, C section Subjective: She looks good and tolerated dressing removal. No flatus or BM's. Endoscopy performed yesterday with well perfused mucosa.     Objective: Vital signs in last 24 hours: Temp:  [97.5 F (36.4 C)-98.4 F (36.9 C)] 98.1 F (36.7 C) (01/21 0349) Pulse Rate:  [69-110] 84 (01/21 0800) Resp:  [15-25] 22 (01/21 0800) BP: (100-123)/(43-81) 120/57 mmHg (01/21 0800) SpO2:  [95 %-99 %] 99 % (01/21 0800)   Intake/Output from previous day: 01/20 0701 - 01/21 0700 In: 2650 [I.V.:2100; IV Piggyback:50] Out: 2500 [Urine:2500] Intake/Output this shift:    General appearance: alert, cooperative, no distress  Resp: clear to auscultation bilaterally GI: soft, incision: clean and dry.  + BS  Lab Results:   Recent Labs  05/29/14 1400 05/30/14 0525  WBC 13.5* 15.1*  HGB 7.5* 7.1*  HCT 25.6* 23.9*  PLT 227 260    BMET  Recent Labs  05/29/14 0408 05/30/14 0525  NA 134* 133*  K 3.4* 4.0  CL 105 103  CO2 24 21  GLUCOSE 75 63*  BUN 12 7  CREATININE 0.43* 0.46*  CALCIUM 7.9* 7.9*   PT/INR No results for input(s): LABPROT, INR in the last 72 hours.   Recent Labs Lab 05/28/14 0525  AST 24  ALT 15  ALKPHOS 133*  BILITOT 0.8  PROT 7.4  ALBUMIN 3.2*     Lipase     Component Value Date/Time   LIPASE 50 05/28/2014 0525     Studies/Results: No results found.  Medications: . enoxaparin (LOVENOX) injection  40 mg Subcutaneous Q24H  . scopolamine  1 patch Transdermal Once    Assessment/Plan 1. nonreassuring fetal heart tracing; internal hernia of small bowel 2. 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 S/p CESAREAN SECTION, EXPLORATORY LAPAROTOMY, LYSIS OF ADHESION WITH REDUCTION OF INTERNAL HERNIA, Surgeon(s): Ovidio Kinavid Newman, MD Catalina AntiguaPeggy Constant, MD Levie HeritageJacob J Stinson, DO, Romie LeveeAlicia Kamuela Magos, MD. 05/28/14.    Plan:  She is stable this AM, Hgb 7.1.  We are not going to transfuse at this point but  watch it closely.  She had a type and screen already. Will try some clears today.  Pt requesting transfer to womens today.  I will discuss with Dr Ezzard StandingNewman.  Pt states baby will be discharged today.   Will transfer to the floor.       LOS: 2 days    Ilze Roselli C. 05/30/2014

## 2014-05-30 NOTE — Progress Notes (Signed)
Subjective: Postpartum Day 2: Cesarean Delivery with repair of internal hernia Patient reports feeling much better. She denies nausea, emesis. She is tolerating clear liquids. She is voiding and started to pass flatus. She ambulated. She denies CP, SOB, lightheadedness/dizziness  Objective: Vital signs in last 24 hours: Temp:  [97.5 F (36.4 C)-98.4 F (36.9 C)] 97.5 F (36.4 C) (01/21 1205) Pulse Rate:  [77-110] 84 (01/21 0800) Resp:  [16-25] 22 (01/21 0800) BP: (100-121)/(55-81) 120/57 mmHg (01/21 0800) SpO2:  [96 %-99 %] 99 % (01/21 0800)  Physical Exam:  General: alert, cooperative and no distress Lochia: appropriate Uterine Fundus: firm, NT Abdomen: soft, appropriately tender, non distended, +BS Incision: healing well, no significant drainage, no significant erythema DVT Evaluation: Negative Homan's sign. No cords or calf tenderness.   Recent Labs  05/29/14 1400 05/30/14 0525  HGB 7.5* 7.1*  HCT 25.6* 23.9*    Assessment/Plan: Status post Cesarean section. Doing well postoperatively.  Continue current care as per general surgery Patient is doing well and is obstetrically cleared for discharge Will plan to see her in 2 weeks following discharge and again at her 6 week postpartum visit Discharge instructions were provided and an appointment will be made for her If further questions arise, please do not hesitate to contact Ob/GYN on call at [phone].  Barbara Burton 05/30/2014, 2:28 PM

## 2014-05-30 NOTE — Progress Notes (Signed)
I spoke to lactation consultant Danton Claphristine Lee and updated her on pt transfer to rm [id number]; informed that she will have baby with her this evening and requested for lactation consultant to contact pt tonight to assist pt with nursing infant. Hamzah Savoca, Georga HackingSusan M, RN

## 2014-05-31 LAB — CBC
HCT: 24.5 % — ABNORMAL LOW (ref 36.0–46.0)
Hemoglobin: 7.2 g/dL — ABNORMAL LOW (ref 12.0–15.0)
MCH: 19.8 pg — ABNORMAL LOW (ref 26.0–34.0)
MCHC: 29.4 g/dL — ABNORMAL LOW (ref 30.0–36.0)
MCV: 67.3 fL — ABNORMAL LOW (ref 78.0–100.0)
PLATELETS: 333 10*3/uL (ref 150–400)
RBC: 3.64 MIL/uL — ABNORMAL LOW (ref 3.87–5.11)
RDW: 16.5 % — ABNORMAL HIGH (ref 11.5–15.5)
WBC: 10.9 10*3/uL — ABNORMAL HIGH (ref 4.0–10.5)

## 2014-05-31 MED ORDER — BOOST / RESOURCE BREEZE PO LIQD
1.0000 | Freq: Three times a day (TID) | ORAL | Status: DC
Start: 2014-05-31 — End: 2014-06-02
  Administered 2014-05-31 – 2014-06-01 (×3): 1 via ORAL

## 2014-05-31 NOTE — Progress Notes (Signed)
INITIAL NUTRITION ASSESSMENT  DOCUMENTATION CODES Per approved criteria  -Obesity Unspecified   INTERVENTION: - Resource Breeze po TID, each supplement provides 250 kcal and 9 grams of protein - Diet advancement per MD as medically tolerated - RD will continue to monitor  NUTRITION DIAGNOSIS: Increased nutrient needs related to breastfeeding as evidenced by pt s/p C-section 1/19.   Goal: Pt to meet >/= 90% of their estimated nutrition needs   Monitor:  Weight trend, po intake, diet advancement  Reason for Assessment: MST  39 y.o. female  Admitting Dx: <principal problem not specified>  ASSESSMENT: 39 y.o. a [id number] at [redacted]w[redacted]d who presents today with abdominal pain. She states that around 0300 today she started to have pain and possibly contractions. She states that the pain is mostly in the RUQ and radiates to the back. Although occasionally she will feel the pain in her lower abdomen. She then woke up her husband around 0400. She denies any vaginal bleeding or LOF. She reports fetal movement. She states that she has had 4 c-sections and has had gastric bypass.   - Pt feels hungry and is ready to have her diet advanced. Per surgery, pt's diet not to be advanced until pt has bowel movement.  - Pt with history of gastric by-pass.  - Current diet is bariatric clear liquids which pt is tolerating. She has been drinking "lots of water and some apple juice." - Will provide clear liquid supplements for pt's increased needs for breastfeeding and post-op healing.   Labs: Na low K WNL Mg low  Height: Ht Readings from Last 1 Encounters:  05/28/14 5' (1.524 m)    Weight: Wt Readings from Last 1 Encounters:  05/29/14 195 lb 1.7 oz (88.5 kg)    Ideal Body Weight: 45.5 kg  % Ideal Body Weight: 195%  Wt Readings from Last 10 Encounters:  05/29/14 195 lb 1.7 oz (88.5 kg)  05/07/14 197 lb 3.2 oz (89.449 kg)  04/25/14 196 lb 12 oz (89.245 kg)  04/23/14 196 lb 9.6 oz (89.177 kg)   04/09/14 198 lb (89.812 kg)  03/14/14 194 lb 6.4 oz (88.179 kg)  02/14/14 193 lb 12 oz (87.884 kg)  02/14/14 193 lb 3.2 oz (87.635 kg)  01/18/14 185 lb 12.8 oz (84.278 kg)  12/21/13 182 lb 14.4 oz (82.963 kg)   BMI:  Body mass index is 38.1 kg/(m^2).  Estimated Nutritional Needs: Kcal: 2200-2400 Protein: 115-125 g Fluid: 2.2-2.4 L/day  Skin: closed incision on abdomen and perineum  Diet Order: Diet bariatric clear liquid  EDUCATION NEEDS: -Education needs addressed   Intake/Output Summary (Last 24 hours) at 05/31/14 1021 Last data filed at 05/31/14 1000  Gross per 24 hour  Intake   1200 ml  Output   2780 ml  Net  -1580 ml    Last BM: prior to admission   Labs:   Recent Labs Lab 05/28/14 0525 05/29/14 0408 05/30/14 0525  NA 136 134* 133*  K 3.7 3.4* 4.0  CL 108 105 103  CO2 18* 24 21  BUN CREATININE 0.37* 0.43* 0.46*  CALCIUM 9.2 7.9* 7.9*  MG  --  1.4*  --   GLUCOSE 124* 75 63*    CBG (last 3)  No results for input(s): GLUCAP in the last 72 hours.  Scheduled Meds: . enoxaparin (LOVENOX) injection  40 mg Subcutaneous Q24H  . prenatal multivitamin  1 tablet Oral Daily    Continuous Infusions: . 0.9 % NaCl with KCl 40  mEq / L 50 mL/hr at 05/31/14 0600    Past Medical History  Diagnosis Date  . Medical history non-contributory     Past Surgical History  Procedure Laterality Date  . Gastric bypass    . Cesarean section    . Gallbladder surgery    . Cesarean section N/A 05/28/2014    Procedure: CESAREAN SECTION;  Surgeon: Catalina AntiguaPeggy Constant, MD;  Location: [facility];  Service: Obstetrics;  Laterality: N/A;  . Laparotomy N/A 05/28/2014    Procedure: EXPLORATORY LAPAROTOMY;  Surgeon: Romie LeveeAlicia Thomas, MD;  Location: [facility];  Service: General;  Laterality: N/A;  . Lysis of adhesion N/A 05/28/2014    Procedure: LYSIS OF ADHESION and reduction of internal hernia ;  Surgeon: Romie LeveeAlicia Thomas, MD;  Location: [facility];  Service: General;  Laterality: N/A;  .  Esophagogastroduodenoscopy N/A 05/29/2014    Procedure: ESOPHAGOGASTRODUODENOSCOPY (EGD);  Surgeon: Ovidio Kinavid Newman, MD;  [facility];  Service: General;  Laterality: N/A;    Emmaline KluverHaley Sully Dyment MS, RD, LDN

## 2014-05-31 NOTE — Progress Notes (Signed)
2 Days Post-Op  Subjective: She looks good, up some and voiding, no Bm, + flatus,  Baby is in the room and she is breast feeding.  Objective: Vital signs in last 24 hours: Temp:  [97.5 F (36.4 C)-98.4 F (36.9 C)] 98.2 F (36.8 C) (01/22 0510) Pulse Rate:  [78-102] 78 (01/22 0510) Resp:  [18-24] 18 (01/22 0510) BP: (122-163)/(62-80) 163/80 mmHg (01/22 0510) SpO2:  [98 %-100 %] 98 % (01/22 0510)  120 PO  Clears Afebrile, VSS No labs today Intake/Output from previous day: 01/21 0701 - 01/22 0700 In: 1470 [P.O.:120; I.V.:1350] Out: 3280 [Urine:3280] Intake/Output this shift:    General appearance: alert, cooperative and no distress Resp: clear to auscultation bilaterally GI: soft. sore, + BS, no BM, incision looks good.  Lab Results:   Recent Labs  05/29/14 1400 05/30/14 0525  WBC 13.5* 15.1*  HGB 7.5* 7.1*  HCT 25.6* 23.9*  PLT 227 260    BMET  Recent Labs  05/29/14 0408 05/30/14 0525  NA 134* 133*  K 3.4* 4.0  CL 105 103  CO2 24 21  GLUCOSE 75 63*  BUN 12 7  CREATININE 0.43* 0.46*  CALCIUM 7.9* 7.9*   PT/INR No results for input(s): LABPROT, INR in the last 72 hours.   Recent Labs Lab 05/28/14 0525  AST 24  ALT 15  ALKPHOS 133*  BILITOT 0.8  PROT 7.4  ALBUMIN 3.2*     Lipase     Component Value Date/Time   LIPASE 50 05/28/2014 0525     Studies/Results: No results found.  Medications: . enoxaparin (LOVENOX) injection  40 mg Subcutaneous Q24H  . prenatal multivitamin  1 tablet Oral Daily    Assessment/Plan 1. nonreassuring fetal heart tracing; internal hernia of small bowel 2. 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 S/p CESAREAN SECTION, EXPLORATORY LAPAROTOMY, LYSIS OF ADHESION WITH REDUCTION OF INTERNAL HERNIA, Surgeon(s): Ovidio Kinavid Newman, MD Catalina AntiguaPeggy Constant, MD Levie HeritageJacob J Stinson, DO, Romie LeveeAlicia Thomas, MD. 05/28/14.   Plan:  Keep her on clears till bowel function is back to normal.  Check CBC, continue to  mobilize.     LOS: 3 days    Jesus Nevills 05/31/2014

## 2014-05-31 NOTE — Progress Notes (Signed)
Patient's IV infiltrated and removed, patient is a difficult iv restart so IV team was consulted, patient refused for any IV to be restarted in her right arm because it was so tender and sore, IV was unsuccessful in obtaining IV access in her left arm, notified PA Sherrie GeorgeWillard Jennings, call OR to have anesthesia start patient's IV and personnel was on the way up, went to speak with patient to let her now that someone from the operating would be up to restart her IV and patient stated "no", "I'm flat out refusing this IV restart.ibuprofen have had it". Stanford BreedBracey, Juron Vorhees N RN 3:44 PM 05-31-2014

## 2014-06-01 LAB — CBC
HCT: 23.7 % — ABNORMAL LOW (ref 36.0–46.0)
Hemoglobin: 7 g/dL — ABNORMAL LOW (ref 12.0–15.0)
MCH: 19.8 pg — ABNORMAL LOW (ref 26.0–34.0)
MCHC: 29.5 g/dL — AB (ref 30.0–36.0)
MCV: 67.1 fL — ABNORMAL LOW (ref 78.0–100.0)
PLATELETS: 321 10*3/uL (ref 150–400)
RBC: 3.53 MIL/uL — AB (ref 3.87–5.11)
RDW: 16.6 % — AB (ref 11.5–15.5)
WBC: 8.7 10*3/uL (ref 4.0–10.5)

## 2014-06-01 NOTE — Progress Notes (Signed)
3 Days Post-Op  Subjective: Passing gas hungry pain well controlled on po pain meds  Objective: Vital signs in last 24 hours: Temp:  [98 F (36.7 C)-98.2 F (36.8 C)] 98 F (36.7 C) (01/23 0530) Pulse Rate:  [74-84] 78 (01/23 0530) Resp:  [18] 18 (01/23 0530) BP: (125-142)/(72-83) 125/72 mmHg (01/23 0530) SpO2:  [98 %-99 %] 99 % (01/23 0530)    Intake/Output from previous day: 01/22 0701 - 01/23 0700 In: 400 [I.V.:400] Out: 1000 [Urine:1000] Intake/Output this shift: Total I/O In: 120 [P.O.:120] Out: -   Incision/Wound:clean dry intact.  soft  Lab Results:   Recent Labs  05/31/14 0930 06/01/14 0510  WBC 10.9* 8.7  HGB 7.2* 7.0*  HCT 24.5* 23.7*  PLT 333 321   BMET  Recent Labs  05/30/14 0525  NA 133*  K 4.0  CL 103  CO2 21  GLUCOSE 63*  BUN 7  CREATININE 0.46*  CALCIUM 7.9*   PT/INR No results for input(s): LABPROT, INR in the last 72 hours. ABG No results for input(s): PHART, HCO3 in the last 72 hours.  Invalid input(s): PCO2, PO2  Studies/Results: No results found.  Anti-infectives: Anti-infectives    Start     Dose/Rate Route Frequency Ordered Stop   05/28/14 0945  cefoTEtan (CEFOTAN) 2 g in dextrose 5 % 50 mL IVPB  Status:  Discontinued     2 g100 mL/hr over 30 Minutes Intravenous  Once 05/28/14 0940 05/28/14 1520   05/28/14 0815  ceFAZolin (ANCEF) IVPB 2 g/50 mL premix  Status:  Discontinued     2 g100 mL/hr over 30 Minutes Intravenous On call to O.R. 05/28/14 0800 05/28/14 1358      Assessment/Plan: s/p Procedure(s): ESOPHAGOGASTRODUODENOSCOPY (EGD) (N/A) Patient Active Problem List   Diagnosis Date Noted  . Internal hernia 05/28/2014  . Pregnancy with generalized abdominal pain, antepartum   . [redacted] weeks gestation of pregnancy   . Maternal age 37+, multigravida, antepartum   . AMA (advanced maternal age) multigravida 35+   . Hx of cesarean section complicating pregnancy   . Hx of gastric bypass   . Previous gestational diabetes  mellitus, antepartum   . ASCUS with positive high risk human papillomavirus of vagina 11/29/2013  . Supervision of high risk pregnancy, antepartum 11/28/2013  . History of gastric bypass 11/28/2013  . Obesity complicating pregnancy in second trimester 11/28/2013  . Anemia affecting pregnancy, antepartum 10/19/2013  advance diet Cont po pain meds Home sunday  LOS: 4 days    Marrisa Kimber A. 06/01/2014

## 2014-06-02 MED ORDER — ONDANSETRON HCL 4 MG PO TABS: 4.0000 mg | ORAL_TABLET | Freq: Four times a day (QID) | ORAL | Status: DC | PRN

## 2014-06-02 MED ORDER — OXYCODONE-ACETAMINOPHEN 5-325 MG PO TABS: 1.0000 | ORAL_TABLET | ORAL | Status: DC | PRN

## 2014-06-02 NOTE — Discharge Instructions (Signed)
[facility]      [facility] [phone]  OPEN ABDOMINAL SURGERY: POST OP INSTRUCTIONS  Always review your discharge instruction sheet given to you by the facility where your surgery was performed.  IF YOU HAVE DISABILITY OR FAMILY LEAVE FORMS, YOU MUST BRING THEM TO THE OFFICE FOR PROCESSING.  PLEASE DO NOT GIVE THEM TO YOUR DOCTOR.  1. A prescription for pain medication may be given to you upon discharge.  Take your pain medication as prescribed, if needed.  If narcotic pain medicine is not needed, then you may take acetaminophen (Tylenol) or ibuprofen (Advil) as needed. 2. Take your usually prescribed medications unless otherwise directed. 3. If you need a refill on your pain medication, please contact your pharmacy. They will contact our office to request authorization.  Prescriptions will not be filled after 5pm or on week-ends. 4. You should follow a light diet the first few days after arrival home, such as soup and crackers, pudding, etc.unless your doctor has advised otherwise. A high-fiber, low fat diet can be resumed as tolerated.   Be sure to include lots of fluids daily. Most patients will experience some swelling and bruising on the chest and neck area.  Ice packs will help.  Swelling and bruising can take several days to resolve 5. Most patients will experience some swelling and bruising in the area of the incision. Ice pack will help. Swelling and bruising can take several days to resolve..  6. It is common to experience some constipation if taking pain medication after surgery.  Increasing fluid intake and taking a stool softener will usually help or prevent this problem from occurring.  A mild laxative (Milk of Magnesia or Miralax) should be taken according to package directions if there are no bowel movements after 48 hours. 7.  You may have steri-strips (small skin tapes) in place directly over the incision.  These strips should be left on the skin for 7-10 days.  If your  surgeon used skin glue on the incision, you may shower in 24 hours.  The glue will flake off over the next 2-3 weeks.  Any sutures or staples will be removed at the office during your follow-up visit. You may find that a light gauze bandage over your incision may keep your staples from being rubbed or pulled. You may shower and replace the bandage daily. 8. ACTIVITIES:  You may resume regular (light) daily activities beginning the next day--such as daily self-care, walking, climbing stairs--gradually increasing activities as tolerated.  You may have sexual intercourse when it is comfortable.  Refrain from any heavy lifting or straining until approved by your doctor. a. You may drive when you no longer are taking prescription pain medication, you can comfortably wear a seatbelt, and you can safely maneuver your car and apply brakes b. Return to Work: ___________________________________ 9. You should see your doctor in the office for a follow-up appointment approximately two weeks after your surgery.  Make sure that you call for this appointment within a day or two after you arrive home to insure a convenient appointment time. OTHER INSTRUCTIONS:  _____________________________________________________________ _____________________________________________________________  WHEN TO CALL YOUR DOCTOR: 1. Fever over 101.0 2. Inability to urinate 3. Nausea and/or vomiting 4. Extreme swelling or bruising 5. Continued bleeding from incision. 6. Increased pain, redness, or drainage from the incision. 7. Difficulty swallowing or breathing 8. Muscle cramping or spasms. 9. Numbness or tingling in hands or feet or around lips.  The clinic staff is available to   answer your questions during regular business hours.  Please don't hesitate to call and ask to speak to one of the nurses if you have concerns.  For further questions, please visit [email or web address]   

## 2014-06-02 NOTE — Progress Notes (Signed)
4 Days Post-Op  Subjective: Pt doing well passing gas and tolerating diet.  On percocet for pain and this is effective.   Objective: Vital signs in last 24 hours: Temp:  [97.4 F (36.3 C)-98 F (36.7 C)] 98 F (36.7 C) (01/24 0640) Pulse Rate:  [72-81] 72 (01/24 0640) Resp:  [16-18] 18 (01/24 0640) BP: (111-122)/(57-66) 111/66 mmHg (01/24 0640) SpO2:  [99 %-100 %] 100 % (01/24 0640) Last BM Date: 06/01/14  Intake/Output from previous day: 01/23 0701 - 01/24 0700 In: 600 [P.O.:600] Out: -  Intake/Output this shift:    Incision/Wound:soft NT CDI      Lab Results:   Recent Labs  05/31/14 0930 06/01/14 0510  WBC 10.9* 8.7  HGB 7.2* 7.0*  HCT 24.5* 23.7*  PLT 333 321   BMET No results for input(s): NA, K, CL, CO2, GLUCOSE, BUN, CREATININE, CALCIUM in the last 72 hours. PT/INR No results for input(s): LABPROT, INR in the last 72 hours. ABG No results for input(s): PHART, HCO3 in the last 72 hours.  Invalid input(s): PCO2, PO2  Studies/Results: No results found.  Anti-infectives: Anti-infectives    Start     Dose/Rate Route Frequency Ordered Stop   05/28/14 0945  cefoTEtan (CEFOTAN) 2 g in dextrose 5 % 50 mL IVPB  Status:  Discontinued     2 g100 mL/hr over 30 Minutes Intravenous  Once 05/28/14 0940 05/28/14 1520   05/28/14 0815  ceFAZolin (ANCEF) IVPB 2 g/50 mL premix  Status:  Discontinued     2 g100 mL/hr over 30 Minutes Intravenous On call to O.R. 05/28/14 0800 05/28/14 1358      Assessment/Plan: s/p Procedure(s): ESOPHAGOGASTRODUODENOSCOPY (EGD) (N/A)   Patient Active Problem List   Diagnosis Date Noted  . Internal hernia 05/28/2014  . Pregnancy with generalized abdominal pain, antepartum   . [redacted] weeks gestation of pregnancy   . Maternal age 37+, multigravida, antepartum   . AMA (advanced maternal age) multigravida 35+   . Hx of cesarean section complicating pregnancy   . Hx of gastric bypass   . Previous gestational diabetes mellitus, antepartum    . ASCUS with positive high risk human papillomavirus of vagina 11/29/2013  . Supervision of high risk pregnancy, antepartum 11/28/2013  . History of gastric bypass 11/28/2013  . Obesity complicating pregnancy in second trimester 11/28/2013  . Anemia affecting pregnancy, antepartum 10/19/2013      Discharge      LOS: 5 days    Barbara Burton A. 06/02/2014

## 2014-06-02 NOTE — Discharge Summary (Signed)
Reviewed discharge instructions with pt including incision care, medications, and follow-up appointments.  Addressed pt questions; pt verbalized good understanding of all topics discussed.  Pt being d/c to home into care of spouse.

## 2014-06-06 ENCOUNTER — Encounter: Payer: Medicaid Other | Admitting: Family

## 2014-06-07 ENCOUNTER — Telehealth: Payer: Self-pay | Admitting: Obstetrics and Gynecology

## 2014-06-07 ENCOUNTER — Inpatient Hospital Stay (HOSPITAL_COMMUNITY): Admission: RE | Admit: 2014-06-07 | Payer: Medicaid Other | Source: Ambulatory Visit

## 2014-06-07 NOTE — Telephone Encounter (Signed)
Patient called seeking medication for her nipple pain and skin irritation. States her baby has thrush in mouth and and is getting treatment for yeast. Per Dr. Lara MulchHarrow ay-Brester advise patient to get Lotrisone Cream OTC. Advised patient as well that if symptoms persist to call clinic back for further evaluation. Pt agrees to all and satisfied.

## 2014-06-10 ENCOUNTER — Inpatient Hospital Stay (HOSPITAL_COMMUNITY)
Admission: RE | Admit: 2014-06-10 | Payer: Medicaid Other | Source: Ambulatory Visit | Admitting: Obstetrics and Gynecology

## 2014-06-10 ENCOUNTER — Encounter (HOSPITAL_COMMUNITY): Admission: RE | Payer: Self-pay | Source: Ambulatory Visit

## 2014-06-10 SURGERY — Surgical Case
Anesthesia: Regional | Site: Abdomen

## 2014-06-13 ENCOUNTER — Encounter: Payer: Self-pay | Admitting: Obstetrics and Gynecology

## 2014-06-13 ENCOUNTER — Ambulatory Visit (INDEPENDENT_AMBULATORY_CARE_PROVIDER_SITE_OTHER): Payer: Medicaid Other | Admitting: Obstetrics and Gynecology

## 2014-06-13 VITALS — BP 127/84 | HR 68 | Temp 98.0°F | Ht 60.0 in | Wt 170.7 lb

## 2014-06-13 DIAGNOSIS — Z5189 Encounter for other specified aftercare: Secondary | ICD-10-CM

## 2014-06-13 MED ORDER — FLUCONAZOLE 200 MG PO TABS
ORAL_TABLET | ORAL | Status: DC
Start: 2014-06-13 — End: 2018-01-29

## 2014-06-13 NOTE — Progress Notes (Signed)
Patient ID: Barbara GainMarie A Haapala, female   DOB: 03/12/1976, 39 y.o.   MRN: [id number] 39 yo G5P5 s/p repeat cesarean section with internal hernia repair on 05/29/2014 presenting today for wound check. Patient has been doing well other than a breast yeast infection.   GENERAL: Well-developed, well-nourished female in no acute distress.  ABDOMEN: Soft, nontender, nondistended. Incision: no erythema, induration or drainage. Healing well EXTREMITIES: No cyanosis, clubbing, or edema, 2+ distal pulses.  A/P 39 yo s/p repeat cesarean section on 05/29/2014 - Rx flucanazole provided - patient considering OCP for contraception - RTC in 4 weeks for postpartum exam

## 2014-07-12 ENCOUNTER — Other Ambulatory Visit (HOSPITAL_COMMUNITY)
Admission: RE | Admit: 2014-07-12 | Discharge: 2014-07-12 | Disposition: A | Discharge status: Home / Self Care | Payer: Medicaid Other | Source: Ambulatory Visit | Attending: Obstetrics and Gynecology | Admitting: Obstetrics and Gynecology

## 2014-07-12 ENCOUNTER — Ambulatory Visit (INDEPENDENT_AMBULATORY_CARE_PROVIDER_SITE_OTHER): Payer: Medicaid Other | Admitting: Obstetrics and Gynecology

## 2014-07-12 DIAGNOSIS — Z1151 Encounter for screening for human papillomavirus (HPV): Secondary | ICD-10-CM

## 2014-07-12 DIAGNOSIS — Z01411 Encounter for gynecological examination (general) (routine) with abnormal findings: Secondary | ICD-10-CM | POA: Diagnosis present

## 2014-07-12 DIAGNOSIS — Z124 Encounter for screening for malignant neoplasm of cervix: Secondary | ICD-10-CM

## 2014-07-12 MED ORDER — NORGESTIMATE-ETH ESTRADIOL 0.25-35 MG-MCG PO TABS: 1.0000 | ORAL_TABLET | Freq: Every day | ORAL | Status: DC

## 2014-07-12 NOTE — Progress Notes (Signed)
  Subjective:     Barbara Burton is a 39 y.o. female who presents for a postpartum visit. She is 6 weeks postpartum following a low cervical transverse Cesarean section. I have fully reviewed the prenatal and intrapartum course. The delivery was at 37.2 gestational weeks. Outcome: repeat cesarean section, low transverse incision and with internal hernia repair by general surgery. Anesthesia: epidural. Postpartum course has been uncomplicated. Baby's course has been uncomplicated. Baby is feeding by bottle - Similac Advance. Bleeding no bleeding. Bowel function is normal. Bladder function is normal. Patient is not sexually active. Contraception method is none. Postpartum depression screening: negative.     Review of Systems A comprehensive review of systems was negative.   Objective:    There were no vitals taken for this visit.  General:  alert, cooperative and no distress   Breasts:  inspection negative, no nipple discharge or bleeding, no masses or nodularity palpable  Lungs: clear to auscultation bilaterally  Heart:  regular rate and rhythm  Abdomen: soft, non-tender; bowel sounds normal; no masses,  no organomegaly and incision healed completely   Vulva:  normal  Vagina: normal vagina, no discharge, exudate, lesion, or erythema  Cervix:  no cervical motion tenderness  Corpus: normal size, contour, position, consistency, mobility, non-tender  Adnexa:  no mass, fullness, tenderness  Rectal Exam: Not performed.        Assessment:     Normal postpartum exam. Pap smear done at today's visit secondary to ASCUS + HPV pap smear in July.   Plan:    1. Contraception: OCP (estrogen/progesterone) 2. Patient is medically cleared to resume all activities of daily living 3. Follow up as needed, patient will be contacted with any results.

## 2014-07-13 ENCOUNTER — Encounter (HOSPITAL_COMMUNITY)
Admission: RE | Admit: 2014-07-13 | Discharge: 2014-07-13 | Disposition: A | Discharge status: Home / Self Care | Payer: Medicaid Other | Source: Ambulatory Visit | Attending: Obstetrics and Gynecology | Admitting: Obstetrics and Gynecology

## 2014-07-13 DIAGNOSIS — O923 Agalactia: Secondary | ICD-10-CM | POA: Insufficient documentation

## 2014-07-15 LAB — CYTOLOGY - PAP

## 2014-07-17 ENCOUNTER — Telehealth: Payer: Self-pay | Admitting: *Deleted

## 2014-07-17 NOTE — Telephone Encounter (Signed)
-----   Message from Catalina AntiguaPeggy Constant, MD sent at 07/16/2014  9:22 PM EST ----- Patient has abnormal pap smear and needs colposcopy  Thanks  peggy

## 2014-07-17 NOTE — Telephone Encounter (Signed)
Contacted patient, informed of results and need for colposcopy.  Informed patient the front office will call with a date/time for the procedure.  Pt verbalizes understanding and has no further question or concerns.

## 2014-07-23 ENCOUNTER — Telehealth: Payer: Self-pay | Admitting: General Practice

## 2014-07-23 ENCOUNTER — Encounter: Payer: Self-pay | Admitting: Family Medicine

## 2014-07-23 NOTE — Telephone Encounter (Signed)
-----   Message from Vivien Rotaheryl A Clinton sent at 07/23/2014  9:51 AM EDT ----- [facility]. Request has been done    ----- Message -----    From: Kathee Deltonarrie L Jeramy Dimmick, RN    Sent: 07/17/2014  11:11 AM      To: [facility] Admin Pool  Please schedule appt and we will call patient. Thanks!  ----- Message -----    From: Catalina AntiguaPeggy Constant, MD    Sent: 07/16/2014   9:22 PM      To: [facility] Clinical Pool  Patient has abnormal pap smear and needs colposcopy  Thanks  peggy

## 2014-07-23 NOTE — Telephone Encounter (Signed)
Called patient and informed her of appt. Patient verbalized understanding and had no questions

## 2014-08-13 ENCOUNTER — Encounter (HOSPITAL_COMMUNITY)
Admission: RE | Admit: 2014-08-13 | Discharge: 2014-08-13 | Disposition: A | Discharge status: Home / Self Care | Payer: Medicaid Other | Source: Ambulatory Visit | Attending: Obstetrics and Gynecology | Admitting: Obstetrics and Gynecology

## 2014-08-13 DIAGNOSIS — O923 Agalactia: Secondary | ICD-10-CM | POA: Insufficient documentation

## 2014-08-19 ENCOUNTER — Encounter: Payer: Medicaid Other | Admitting: Family Medicine

## 2014-08-19 ENCOUNTER — Telehealth: Payer: Self-pay

## 2014-08-19 NOTE — Telephone Encounter (Signed)
Patient missed colpo appointment today. Called patient. No answer. Left message stating we are sorry you missed your appointment, please call clinic to reschedule.

## 2014-08-28 ENCOUNTER — Telehealth: Payer: Self-pay | Admitting: *Deleted

## 2014-08-28 NOTE — Telephone Encounter (Signed)
Pt contacted the clinic to request a change in her birth control.  States she is having a lot of break through bleeding and headaches.  Contacted patient, pt is on first pack, discussed with [facility] and advised patient to continue taking birth control.  If bleeding continues after next pack to give the clinic a call.  Pt verbalizes understanding.

## 2014-08-30 ENCOUNTER — Telehealth: Payer: Self-pay | Admitting: *Deleted

## 2014-08-30 NOTE — Telephone Encounter (Signed)
Pt left message that she has talked with a nurse about her heavy bleeding and H/A but would like to speak with someone again. She reports very heavy bleeding and is using a super tampon every 2 hrs as well as severe H/A which will not go away. She does not think she can tolerate these symptoms anymore for the rest of her pack of birth control pills. I called pt and left message that if she feels her bleeding is too heavy or H/A so severe and not responding to OTC meds such as Tylenol or ibuprofen, she should go to [facility] for evaluation. There is nothing else we can advise over the phone and the clinic is now closed. The best course of action would be to try to finish the pack of pills and most likely these symptoms will subside as her body becomes more accustomed to the hormones. She may call back if she has additional questions.

## 2014-09-11 ENCOUNTER — Telehealth: Payer: Self-pay

## 2014-09-11 NOTE — Telephone Encounter (Signed)
Attempted to contact patient. No answer. Left message informing her of rescheduled appointment and advised she call clinic if appointment needs to be rescheduled. Will send letter.

## 2014-09-11 NOTE — Telephone Encounter (Signed)
-----   Message from Vivien Rotaheryl A Clinton sent at 09/11/2014  2:33 PM EDT ----- Appointment is 05/25 @2 :15  ----- Message -----    From: Louanna Rawaylor M Matix Henshaw, RN    Sent: 09/11/2014  10:36 AM      To: [facility]  Patient missed colpo appointment on 08/19/14 and has not yet followed up. Please schedule patient appointment for colpo and send back to me so that I can call patient with appointment and remind her of need for colpo. Thank you!

## 2014-09-12 ENCOUNTER — Encounter (HOSPITAL_COMMUNITY)
Admission: RE | Admit: 2014-09-12 | Discharge: 2014-09-12 | Disposition: A | Discharge status: Home / Self Care | Payer: Medicaid Other | Source: Ambulatory Visit | Attending: Obstetrics and Gynecology | Admitting: Obstetrics and Gynecology

## 2014-09-12 DIAGNOSIS — O923 Agalactia: Secondary | ICD-10-CM | POA: Insufficient documentation

## 2014-10-03 ENCOUNTER — Encounter: Payer: Medicaid Other | Admitting: Family Medicine

## 2014-10-13 ENCOUNTER — Encounter (HOSPITAL_COMMUNITY)
Admission: RE | Admit: 2014-10-13 | Discharge: 2014-10-13 | Disposition: A | Discharge status: Home / Self Care | Payer: Medicaid Other | Source: Ambulatory Visit | Attending: Obstetrics and Gynecology | Admitting: Obstetrics and Gynecology

## 2014-10-13 DIAGNOSIS — O923 Agalactia: Secondary | ICD-10-CM | POA: Diagnosis not present

## 2014-10-21 ENCOUNTER — Telehealth: Payer: Self-pay | Admitting: General Practice

## 2014-10-21 DIAGNOSIS — Z3041 Encounter for surveillance of contraceptive pills: Secondary | ICD-10-CM

## 2014-10-21 MED ORDER — NORETHIN ACE-ETH ESTRAD-FE 1-20 MG-MCG(24) PO TABS: 1.0000 | ORAL_TABLET | Freq: Every day | ORAL | Status: DC

## 2014-10-21 NOTE — Telephone Encounter (Signed)
Patient called and left message stating she is calling about her birth control. States she has given it two more months, two more pill packs and wants something else. States she is having bleeding before she's supposed to start her period and her periods last over a week. Reports also having continuous headaches as well. Spoke to Dr Macon Large who authorized Rx for Loestrin 24. Called patient and informed her of change in therapy and Rx waiting at her [facility] pharmacy. Patient verbalized understanding and had no questions

## 2014-10-23 ENCOUNTER — Encounter: Payer: Medicaid Other | Admitting: Obstetrics & Gynecology

## 2014-11-13 ENCOUNTER — Encounter (HOSPITAL_COMMUNITY)
Admission: RE | Admit: 2014-11-13 | Discharge: 2014-11-13 | Disposition: A | Discharge status: Home / Self Care | Payer: Medicaid Other | Source: Ambulatory Visit | Attending: Obstetrics and Gynecology | Admitting: Obstetrics and Gynecology

## 2014-11-13 DIAGNOSIS — O923 Agalactia: Secondary | ICD-10-CM | POA: Diagnosis not present

## 2014-12-14 ENCOUNTER — Encounter (HOSPITAL_COMMUNITY)
Admission: RE | Admit: 2014-12-14 | Discharge: 2014-12-14 | Disposition: A | Discharge status: Home / Self Care | Payer: Medicaid Other | Source: Ambulatory Visit | Attending: Obstetrics and Gynecology | Admitting: Obstetrics and Gynecology

## 2014-12-14 DIAGNOSIS — O923 Agalactia: Secondary | ICD-10-CM | POA: Insufficient documentation

## 2015-01-14 ENCOUNTER — Encounter (HOSPITAL_COMMUNITY)
Admission: RE | Admit: 2015-01-14 | Discharge: 2015-01-14 | Disposition: A | Discharge status: Home / Self Care | Payer: Medicaid Other | Source: Ambulatory Visit | Attending: Obstetrics & Gynecology | Admitting: Obstetrics & Gynecology

## 2015-02-13 ENCOUNTER — Encounter (HOSPITAL_COMMUNITY)
Admission: RE | Admit: 2015-02-13 | Discharge: 2015-02-13 | Disposition: A | Discharge status: Home / Self Care | Payer: Medicaid Other | Source: Ambulatory Visit | Attending: Obstetrics & Gynecology | Admitting: Obstetrics & Gynecology

## 2015-04-15 ENCOUNTER — Encounter (HOSPITAL_COMMUNITY)
Admission: RE | Admit: 2015-04-15 | Discharge: 2015-04-15 | Disposition: A | Discharge status: Home / Self Care | Payer: Medicaid Other | Source: Ambulatory Visit | Attending: Obstetrics & Gynecology | Admitting: Obstetrics & Gynecology

## 2015-05-16 ENCOUNTER — Encounter (HOSPITAL_COMMUNITY)
Admission: RE | Admit: 2015-05-16 | Discharge: 2015-05-16 | Disposition: A | Discharge status: Home / Self Care | Payer: Medicaid Other | Source: Ambulatory Visit | Attending: Obstetrics & Gynecology | Admitting: Obstetrics & Gynecology

## 2015-06-18 ENCOUNTER — Encounter (HOSPITAL_COMMUNITY)
Admission: RE | Admit: 2015-06-18 | Discharge: 2015-06-18 | Disposition: A | Discharge status: Home / Self Care | Payer: Medicaid Other | Source: Ambulatory Visit | Attending: Obstetrics & Gynecology | Admitting: Obstetrics & Gynecology

## 2015-07-18 ENCOUNTER — Encounter (HOSPITAL_COMMUNITY)
Admission: RE | Admit: 2015-07-18 | Discharge: 2015-07-18 | Disposition: A | Discharge status: Home / Self Care | Payer: Medicaid Other | Source: Ambulatory Visit | Attending: Obstetrics & Gynecology | Admitting: Obstetrics & Gynecology

## 2015-08-18 ENCOUNTER — Encounter (HOSPITAL_COMMUNITY)
Admission: RE | Admit: 2015-08-18 | Discharge: 2015-08-18 | Disposition: A | Discharge status: Home / Self Care | Payer: Medicaid Other | Source: Ambulatory Visit | Attending: Obstetrics & Gynecology | Admitting: Obstetrics & Gynecology

## 2015-09-17 ENCOUNTER — Encounter (HOSPITAL_COMMUNITY)
Admission: RE | Admit: 2015-09-17 | Discharge: 2015-09-17 | Disposition: A | Discharge status: Home / Self Care | Payer: Medicaid Other | Source: Ambulatory Visit | Attending: Obstetrics & Gynecology | Admitting: Obstetrics & Gynecology

## 2015-10-19 ENCOUNTER — Encounter (HOSPITAL_COMMUNITY)
Admission: RE | Admit: 2015-10-19 | Discharge: 2015-10-19 | Disposition: A | Discharge status: Home / Self Care | Payer: Medicaid Other | Source: Ambulatory Visit | Attending: Obstetrics & Gynecology | Admitting: Obstetrics & Gynecology

## 2015-11-18 ENCOUNTER — Encounter (HOSPITAL_COMMUNITY)
Admission: RE | Admit: 2015-11-18 | Discharge: 2015-11-18 | Disposition: A | Discharge status: Home / Self Care | Payer: Medicaid Other | Source: Ambulatory Visit | Attending: Obstetrics & Gynecology | Admitting: Obstetrics & Gynecology

## 2015-12-14 IMAGING — US US ABDOMEN COMPLETE
1 series · 14 of 25 positions shown · non-contrast
Comparison: None.

CLINICAL DATA: Nausea vomiting and abdominal pain ; history of
cholecystectomy; currently pregnant

EXAM:
ULTRASOUND ABDOMEN COMPLETE

[Series 1: us abdomen complete · 0.21mm/px · 14 of 80 slices shown]
[im 1/80]
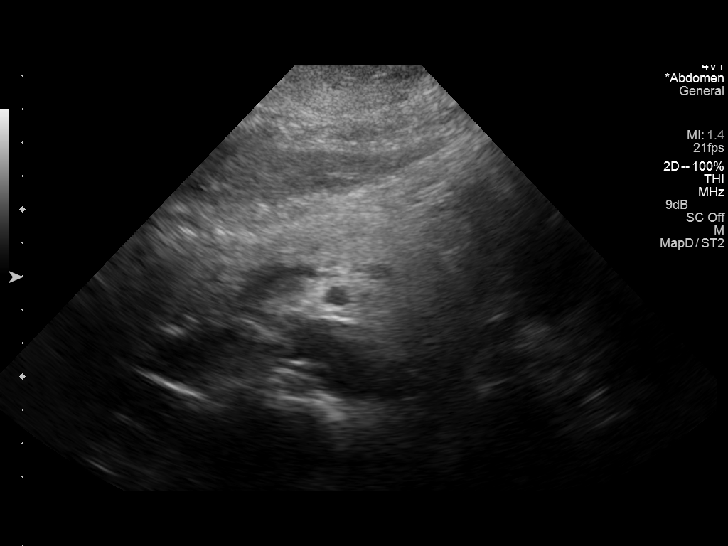
[im 7/80]
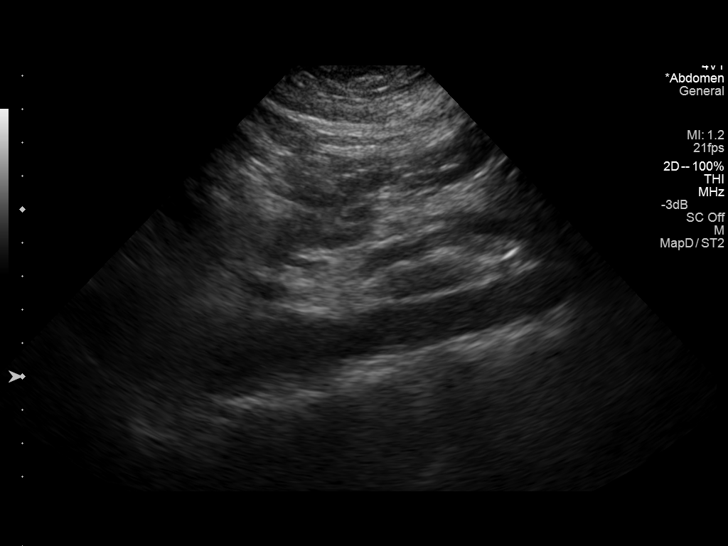
[im 14/80]
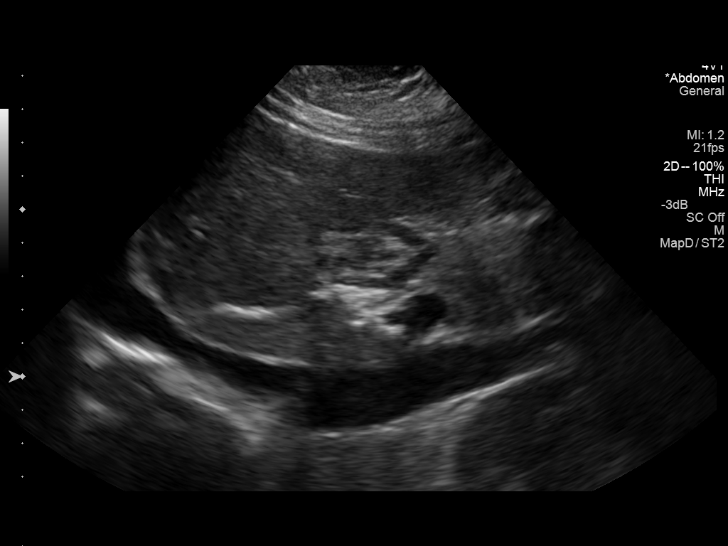
[im 20/80]
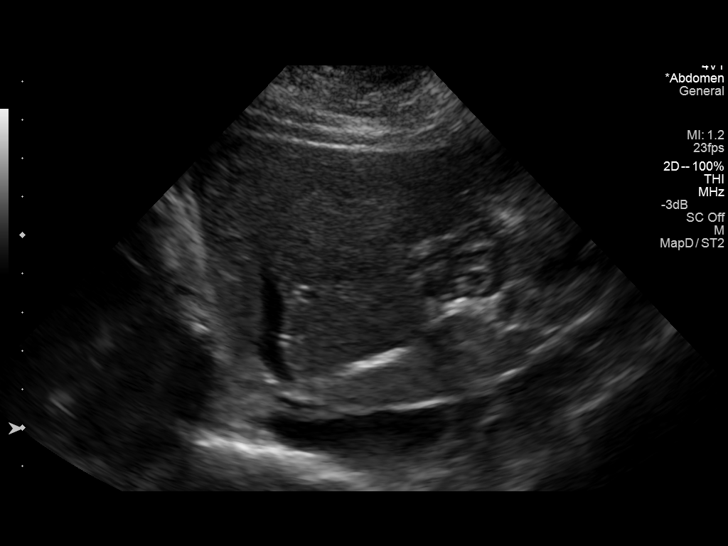
[im 27/80]
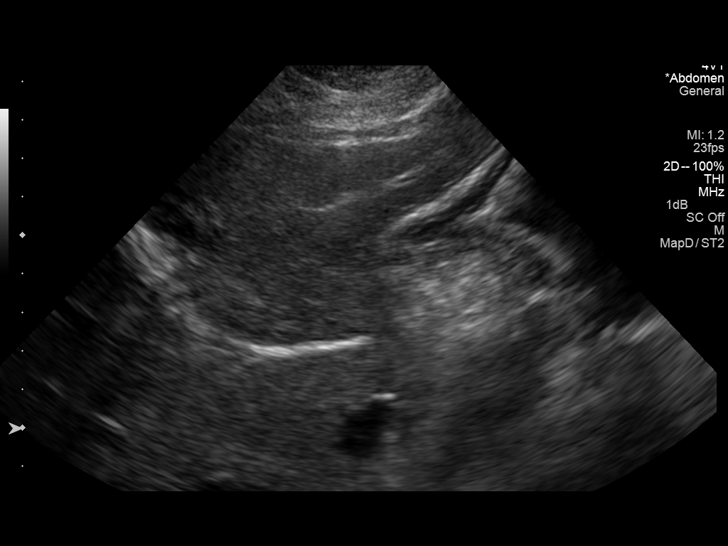
[im 30/80]
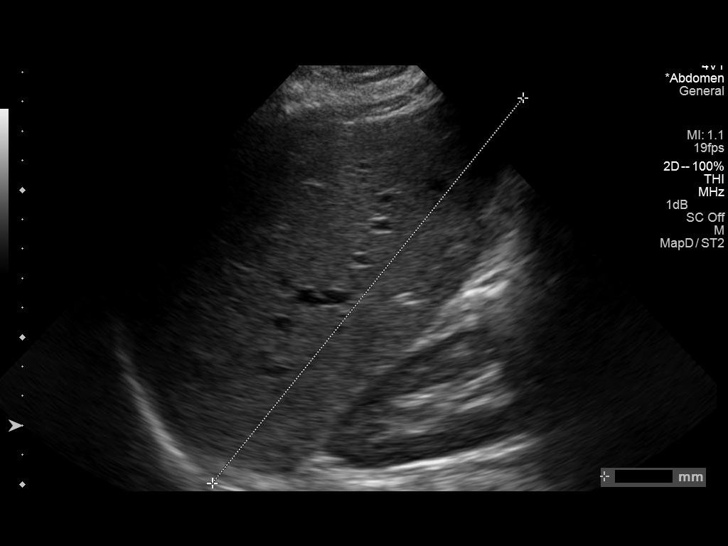
[im 37/80]
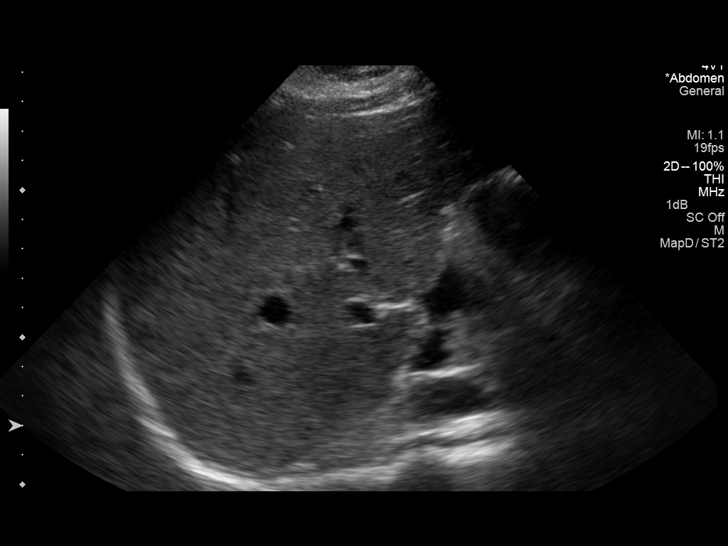
[im 43/80]
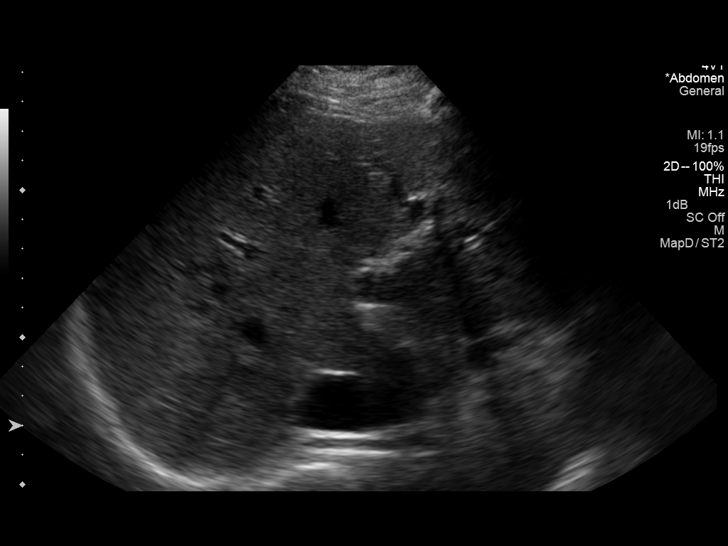
[im 50/80]
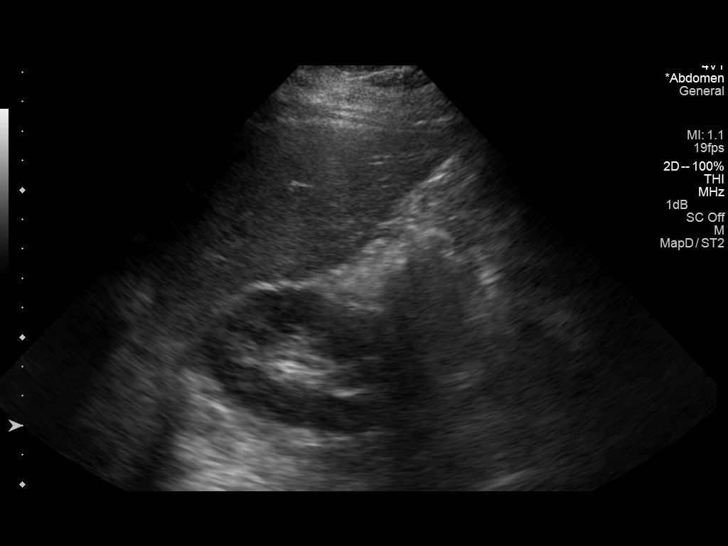
[im 53/80]
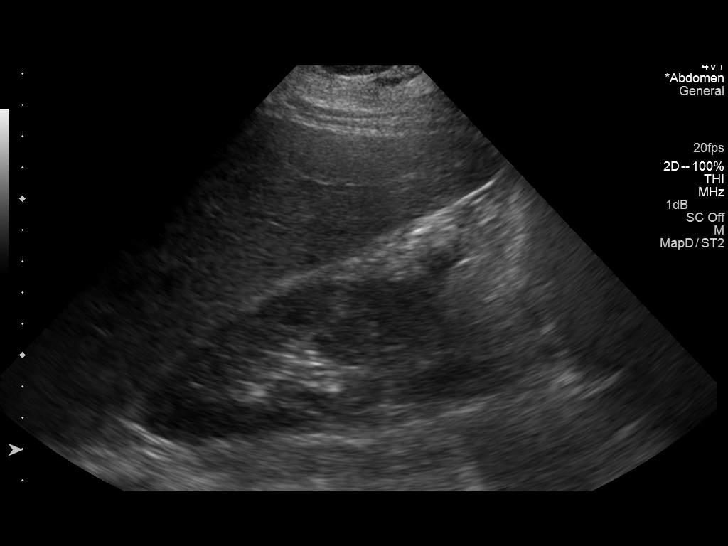
[im 60/80]
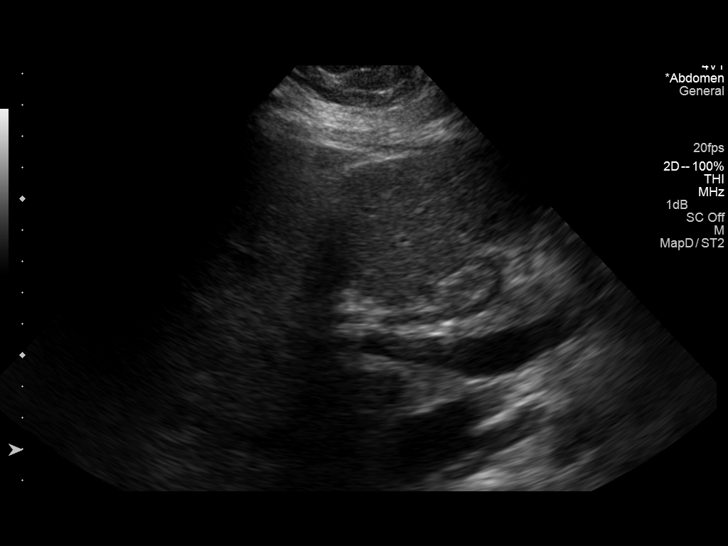
[im 66/80]
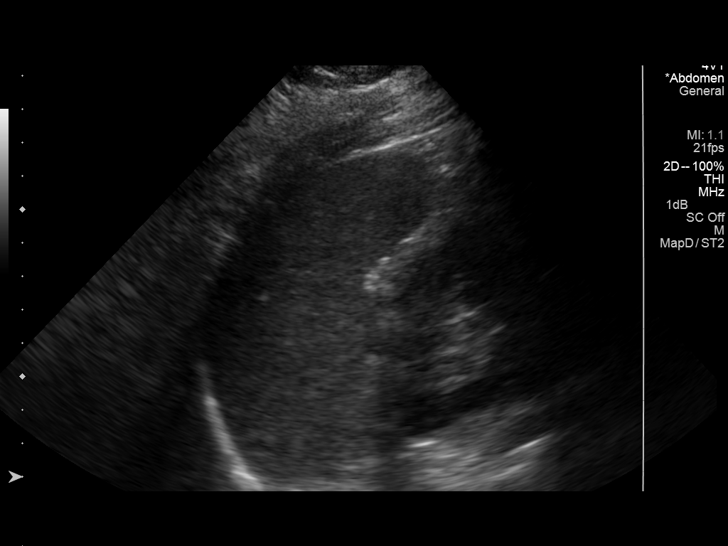
[im 73/80]
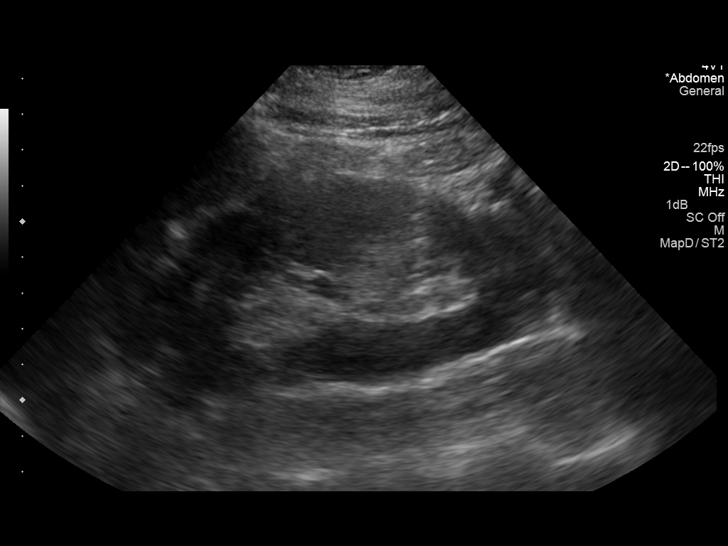
[im 80/80]
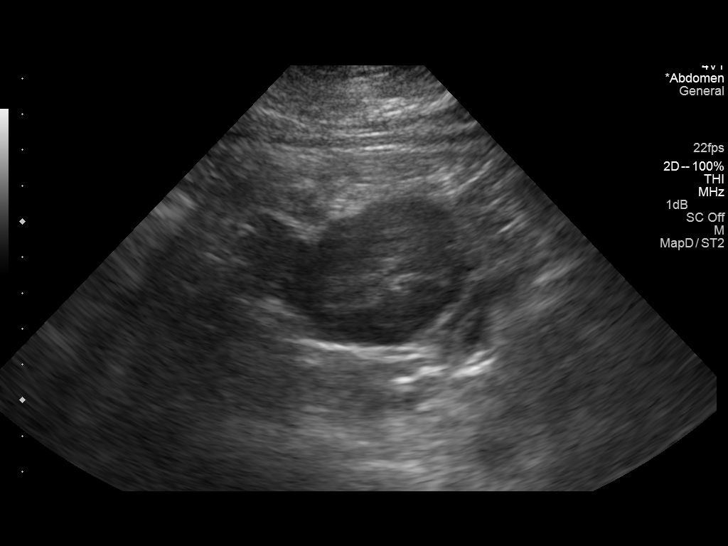

[14 of 25 positions shown; findings below may reference images not displayed]

FINDINGS: Gallbladder:

The gallbladder is surgically absent. There is no tenderness
reported in the right upper quadrant.

Common bile duct:

Diameter: 3.7 mm

Liver:

No focal lesion identified. Within normal limits in parenchymal
echogenicity.

IVC:

No abnormality visualized.

Pancreas:

Visualized portion unremarkable.

Spleen:

Size and appearance within normal limits.

Right Kidney:

Length: 13.6 cm. Echogenicity within normal limits. No mass or
hydronephrosis visualized.

Left Kidney:

Length: 12.5 cm. Echogenicity within normal limits. No mass or
hydronephrosis visualized.

Abdominal aorta:

No aneurysm visualized.

Other findings:

No ascites is demonstrated.
IMPRESSION: Normal abdominal ultrasound examination. The gallbladder is
surgically absent.

## 2015-12-18 ENCOUNTER — Encounter (HOSPITAL_COMMUNITY)
Admission: RE | Admit: 2015-12-18 | Discharge: 2015-12-18 | Disposition: A | Discharge status: Home / Self Care | Payer: Medicaid Other | Source: Ambulatory Visit | Attending: Obstetrics & Gynecology | Admitting: Obstetrics & Gynecology

## 2016-01-19 ENCOUNTER — Encounter (HOSPITAL_COMMUNITY)
Admission: RE | Admit: 2016-01-19 | Discharge: 2016-01-19 | Disposition: A | Discharge status: Home / Self Care | Payer: Medicaid Other | Source: Ambulatory Visit | Attending: Obstetrics & Gynecology | Admitting: Obstetrics & Gynecology

## 2016-02-18 ENCOUNTER — Encounter (HOSPITAL_COMMUNITY)
Admission: RE | Admit: 2016-02-18 | Discharge: 2016-02-18 | Disposition: A | Discharge status: Home / Self Care | Payer: Medicaid Other | Source: Ambulatory Visit | Attending: Obstetrics & Gynecology | Admitting: Obstetrics & Gynecology

## 2016-03-19 ENCOUNTER — Encounter (HOSPITAL_COMMUNITY)
Admission: RE | Admit: 2016-03-19 | Discharge: 2016-03-19 | Disposition: A | Discharge status: Home / Self Care | Payer: Medicaid Other | Source: Ambulatory Visit | Attending: Obstetrics & Gynecology | Admitting: Obstetrics & Gynecology

## 2016-04-19 ENCOUNTER — Ambulatory Visit (HOSPITAL_COMMUNITY)
Admission: RE | Admit: 2016-04-19 | Discharge: 2016-04-19 | Disposition: A | Discharge status: Home / Self Care | Payer: Medicaid Other | Source: Ambulatory Visit | Attending: Obstetrics & Gynecology | Admitting: Obstetrics & Gynecology

## 2016-05-18 ENCOUNTER — Ambulatory Visit (HOSPITAL_COMMUNITY)
Admission: RE | Admit: 2016-05-18 | Discharge: 2016-05-18 | Disposition: A | Discharge status: Home / Self Care | Payer: Medicaid Other | Source: Ambulatory Visit | Attending: Obstetrics & Gynecology | Admitting: Obstetrics & Gynecology

## 2018-01-29 ENCOUNTER — Emergency Department (HOSPITAL_COMMUNITY): Payer: Self-pay

## 2018-01-29 ENCOUNTER — Other Ambulatory Visit: Payer: Self-pay

## 2018-01-29 ENCOUNTER — Encounter (HOSPITAL_COMMUNITY): Payer: Self-pay | Admitting: *Deleted

## 2018-01-29 ENCOUNTER — Observation Stay (HOSPITAL_COMMUNITY)
Admission: EM | Admit: 2018-01-29 | Discharge: 2018-01-29 | Disposition: A | Discharge status: Home / Self Care | Payer: Self-pay | Attending: Internal Medicine | Admitting: Internal Medicine

## 2018-01-29 DIAGNOSIS — Z79899 Other long term (current) drug therapy: Secondary | ICD-10-CM | POA: Insufficient documentation

## 2018-01-29 DIAGNOSIS — Z9884 Bariatric surgery status: Secondary | ICD-10-CM

## 2018-01-29 DIAGNOSIS — D509 Iron deficiency anemia, unspecified: Secondary | ICD-10-CM | POA: Diagnosis present

## 2018-01-29 DIAGNOSIS — R109 Unspecified abdominal pain: Principal | ICD-10-CM | POA: Diagnosis present

## 2018-01-29 DIAGNOSIS — K529 Noninfective gastroenteritis and colitis, unspecified: Secondary | ICD-10-CM | POA: Diagnosis present

## 2018-01-29 DIAGNOSIS — Z87891 Personal history of nicotine dependence: Secondary | ICD-10-CM | POA: Insufficient documentation

## 2018-01-29 LAB — IRON AND TIBC
IRON: 8 ug/dL — AB (ref 28–170)
Saturation Ratios: 2 % — ABNORMAL LOW (ref 10.4–31.8)
TIBC: 429 ug/dL (ref 250–450)
UIBC: 421 ug/dL

## 2018-01-29 LAB — CBC WITH DIFFERENTIAL/PLATELET
Basophils Absolute: 0 10*3/uL (ref 0.0–0.1)
Basophils Relative: 1 %
EOS PCT: 3 %
Eosinophils Absolute: 0.2 10*3/uL (ref 0.0–0.7)
HCT: 26.1 % — ABNORMAL LOW (ref 36.0–46.0)
Hemoglobin: 6.7 g/dL — CL (ref 12.0–15.0)
LYMPHS ABS: 2.1 10*3/uL (ref 0.7–4.0)
LYMPHS PCT: 39 %
MCH: 15.1 pg — AB (ref 26.0–34.0)
MCHC: 25.7 g/dL — ABNORMAL LOW (ref 30.0–36.0)
MCV: 58.8 fL — AB (ref 78.0–100.0)
MONO ABS: 0.5 10*3/uL (ref 0.1–1.0)
Monocytes Relative: 9 %
Neutro Abs: 2.7 10*3/uL (ref 1.7–7.7)
Neutrophils Relative %: 48 %
PLATELETS: 536 10*3/uL — AB (ref 150–400)
RBC: 4.44 MIL/uL (ref 3.87–5.11)
RDW: 19.8 % — AB (ref 11.5–15.5)
WBC: 5.5 10*3/uL (ref 4.0–10.5)

## 2018-01-29 LAB — URINALYSIS, ROUTINE W REFLEX MICROSCOPIC
BILIRUBIN URINE: NEGATIVE
Bacteria, UA: NONE SEEN
GLUCOSE, UA: NEGATIVE mg/dL
Ketones, ur: NEGATIVE mg/dL
Leukocytes, UA: NEGATIVE
Nitrite: NEGATIVE
PH: 7 (ref 5.0–8.0)
Protein, ur: NEGATIVE mg/dL
SPECIFIC GRAVITY, URINE: 1.036 — AB (ref 1.005–1.030)

## 2018-01-29 LAB — RETICULOCYTES
RBC.: 4.46 MIL/uL (ref 3.87–5.11)
Retic Count, Absolute: 44.6 10*3/uL (ref 19.0–186.0)
Retic Ct Pct: 1 % (ref 0.4–3.1)

## 2018-01-29 LAB — PREPARE RBC (CROSSMATCH)

## 2018-01-29 LAB — COMPREHENSIVE METABOLIC PANEL
ALK PHOS: 60 U/L (ref 38–126)
ALT: 12 U/L (ref 0–44)
AST: 16 U/L (ref 15–41)
Albumin: 4.2 g/dL (ref 3.5–5.0)
Anion gap: 4 — ABNORMAL LOW (ref 5–15)
BUN: 12 mg/dL (ref 6–20)
CALCIUM: 9.1 mg/dL (ref 8.9–10.3)
CHLORIDE: 106 mmol/L (ref 98–111)
CO2: 28 mmol/L (ref 22–32)
CREATININE: 0.5 mg/dL (ref 0.44–1.00)
Glucose, Bld: 78 mg/dL (ref 70–99)
Potassium: 3.7 mmol/L (ref 3.5–5.1)
Sodium: 138 mmol/L (ref 135–145)
Total Bilirubin: 0.7 mg/dL (ref 0.3–1.2)
Total Protein: 7.6 g/dL (ref 6.5–8.1)

## 2018-01-29 LAB — FERRITIN: Ferritin: 1 ng/mL — ABNORMAL LOW (ref 11–307)

## 2018-01-29 LAB — ABO/RH: ABO/RH(D): A POS

## 2018-01-29 LAB — I-STAT BETA HCG BLOOD, ED (MC, WL, AP ONLY): I-stat hCG, quantitative: <5 m[IU]/mL (ref <5)

## 2018-01-29 LAB — LIPASE, BLOOD: LIPASE: 43 U/L (ref 11–51)

## 2018-01-29 LAB — FOLATE: FOLATE: 13.1 ng/mL (ref >5.9)

## 2018-01-29 LAB — VITAMIN B12: Vitamin B-12: 395 pg/mL (ref 180–914)

## 2018-01-29 MED ORDER — ACETAMINOPHEN 325 MG PO TABS: 650.0000 mg | ORAL_TABLET | Freq: Four times a day (QID) | ORAL | Status: DC | PRN

## 2018-01-29 MED ORDER — SODIUM CHLORIDE 0.9 % IV BOLUS
1000.0000 mL | Freq: Once | INTRAVENOUS | Status: AC
  Administered 2018-01-29: 1000 mL via INTRAVENOUS

## 2018-01-29 MED ORDER — ACETAMINOPHEN 650 MG RE SUPP: 650.0000 mg | Freq: Four times a day (QID) | RECTAL | Status: DC | PRN

## 2018-01-29 MED ORDER — SODIUM CHLORIDE 0.9% IV SOLUTION: Freq: Once | INTRAVENOUS | Status: DC

## 2018-01-29 MED ORDER — ONDANSETRON HCL 4 MG/2ML IJ SOLN: 4.0000 mg | Freq: Four times a day (QID) | INTRAMUSCULAR | Status: DC | PRN

## 2018-01-29 MED ORDER — ONDANSETRON HCL 4 MG/2ML IJ SOLN
4.0000 mg | Freq: Once | INTRAMUSCULAR | Status: AC
  Administered 2018-01-29: 4 mg via INTRAVENOUS
  Filled 2018-01-29: qty 2

## 2018-01-29 MED ORDER — IOPAMIDOL (ISOVUE-300) INJECTION 61%
100.0000 mL | Freq: Once | INTRAVENOUS | Status: AC | PRN
  Administered 2018-01-29: 100 mL via INTRAVENOUS

## 2018-01-29 MED ORDER — CIPROFLOXACIN IN D5W 400 MG/200ML IV SOLN
400.0000 mg | Freq: Two times a day (BID) | INTRAVENOUS | Status: DC
  Administered 2018-01-29: 400 mg via INTRAVENOUS
  Filled 2018-01-29: qty 200

## 2018-01-29 MED ORDER — ONDANSETRON HCL 4 MG PO TABS: 4.0000 mg | ORAL_TABLET | Freq: Four times a day (QID) | ORAL | Status: DC | PRN

## 2018-01-29 MED ORDER — MORPHINE SULFATE (PF) 4 MG/ML IV SOLN
4.0000 mg | Freq: Once | INTRAVENOUS | Status: AC
  Administered 2018-01-29: 4 mg via INTRAVENOUS
  Filled 2018-01-29: qty 1

## 2018-01-29 MED ORDER — METRONIDAZOLE IN NACL 5-0.79 MG/ML-% IV SOLN
500.0000 mg | Freq: Three times a day (TID) | INTRAVENOUS | Status: DC
  Administered 2018-01-29: 500 mg via INTRAVENOUS
  Filled 2018-01-29: qty 100

## 2018-01-29 MED ORDER — MORPHINE SULFATE (PF) 4 MG/ML IV SOLN
4.0000 mg | INTRAVENOUS | Status: DC | PRN
  Administered 2018-01-29: 4 mg via INTRAVENOUS
  Filled 2018-01-29: qty 1

## 2018-01-29 NOTE — Progress Notes (Signed)
Report called to Inetta Fermoina, Charity fundraiserN at [facility].  Patient to be DC'd from our hospital and will have family transfer her to [facility] after completion on 1 unit of blood

## 2018-01-29 NOTE — ED Notes (Signed)
Date and time results received: 01/29/18 0409   Test: Hgb Critical Value: 6.7  Name of Provider Notified: EDP Delo  Orders Received? Or Actions Taken?: n/a

## 2018-01-29 NOTE — Discharge Summary (Signed)
Physician Discharge Summary  KAYLIANNA DETERT ZOX:[id number] DOB: [birth year] DOA: 01/29/2018  PCP: Patient, No Pcp Per  Admit date: 01/29/2018 Discharge date: 01/29/2018  Admitted From: home Disposition:  [facility]  Recommendations for Outpatient Follow-up:  1. Discharge and transfer care to [facility] center  Discharge Condition: stable CODE STATUS:full code Diet recommendation: NPO  Brief/Interim Summary: 42 year old female with a history of gastric bypass surgery in the past, presents to the hospital with complaints of abdominal pain.  CT scan of the abdomen pelvis indicated mild wall thickening and dilation of small bowel loops in the left hemiabdomen with adjacent mesenteric edema concerning for enteritis.  There was also mild mesenteric rotation of the left hemiabdomen suggesting differential ventral hernia or adhesion.  Patient was also found to have a low hemoglobin of 6.7.  Baseline hemoglobin appears to be just above 7.  Patient was admitted to the hospital, started on intravenous antibiotics with ciprofloxacin and flagyl.  She was seen by general surgery. After discussing with surgery, patient requested to be transferred to [facility] where she has received her prior surgical care.  Due to concerns for underlying mesenteric ischemia, Dr. Lovell Sheehan contacted Dr. Annita Brod at [facility] who has accepted the patient in transfer.  She will remain n.p.o. for now.  Can consider continuing antibiotics upon arrival.  Continue pain management.  Regarding anemia, she had hemoglobin of 6.7.  Anemia panel indicates severe iron deficiency.  Patient reports that she has not been regularly taking her iron supplementation.  She does also report blood in her stools this morning.  This may be related to enteritis.  She was transfused 1 unit of PRBC in the hospital.  She has not had any further bowel movements/blood since admission.  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  Continue to monitor CBC.  Patient's vitals have been stable.  She has not had any further bloody bowel movements.  She appears to be reasonably comfortable.  Patient's family has requested to transport her to [place].  We will send records with the patient on transfer.  Discharge Diagnoses:  Active Problems:   History of gastric bypass   Enteritis   Abdominal pain   Microcytic anemia    Discharge Instructions  Discharge Instructions    Diet - low sodium heart healthy   Complete by:  As directed    Increase activity slowly   Complete by:  As directed      Allergies as of 01/29/2018      Reactions   Sulfa Antibiotics Rash      Medication List    TAKE these medications   acetaminophen 500 MG tablet Commonly known as:  TYLENOL Take 1,000 mg by mouth every 4 (four) hours as needed for mild pain or moderate pain.   ferrous sulfate 325 (65 FE) MG tablet Take 1 tablet (325 mg total) by mouth 3 (three) times daily with meals.       Allergies  Allergen Reactions  . Sulfa Antibiotics Rash    Consultations:  General surgery   Procedures/Studies: Ct Abdomen Pelvis W Contrast  Result Date: 01/29/2018 CLINICAL DATA:  42 y/o F; generalized abdominal pain that started tonight and has gotten worse during the day. EXAM: CT ABDOMEN AND PELVIS WITH CONTRAST TECHNIQUE: Multidetector CT imaging of the abdomen and pelvis was performed using the standard protocol  following bolus administration of intravenous contrast. CONTRAST:  100mL ISOVUE-300 IOPAMIDOL (ISOVUE-300) INJECTION 61% COMPARISON:  None. FINDINGS: Lower chest: No acute abnormality. Hepatobiliary: Non-specific calcification within the right lobe of liver. No additional focal liver abnormality is seen. Status post cholecystectomy. Slight prominence of intra and  extrahepatic bile ducts is likely compensatory post cholecystectomy. Pancreas: Unremarkable. No pancreatic ductal dilatation or surrounding inflammatory changes. Spleen: Normal in size without focal abnormality. Adrenals/Urinary Tract: Adrenal glands are unremarkable. Kidneys are normal, without renal calculi, focal lesion, or hydronephrosis. Bladder is unremarkable. Stomach/Bowel: Gastric bypass. Left upper quadrant anastomosis is patent. There are mildly dilated loops of small bowel in the left hemiabdomen with mesenteric edema and mild rotation of the mesentery (series 2 image [id number]). Vascular/Lymphatic: No significant vascular findings are present. No enlarged abdominal or pelvic lymph nodes. Reproductive: Uterus and bilateral adnexa are unremarkable. 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 Musculoskeletal: No fracture is seen. IMPRESSION: 1. Mild wall thickening and dilation of small bowel loops in left hemiabdomen with adjacent mesenteric edema. Findings may represent acute enteritis. Additionally, there is a mild mesenteric rotation in the left hemiabdomen suggesting differential of internal hernia or adhesion. 2. Postsurgical changes in the anterior abdominal wall with diastasis rectus and several small broad-based ventral hernias containing fat and elements of bowel without obstruction. Electronically Signed   By: Mitzi HansenLance  Furusawa-Stratton M.D.   On: 01/29/2018 05:20       Subjective: Abdominal pain is controlled with pain medications. No vomiting.  Discharge Exam: Vitals:   01/29/18 0635 01/29/18 1300 01/29/18 1337 01/29/18 1358  BP: 96/64 (!) 97/55 (!) 98/56 (!) 101/55  Pulse: 73 (!) 58 64 62  Resp: 19 20 16 16   Temp: 97.8 F (36.6 C) 98.1 F (36.7 C) 98.3 F (36.8 C) 98.4 F (36.9 C)  TempSrc: Oral  Oral Oral  SpO2: 100% 100% 100% 100%  Weight:      Height:        General: Pt  is alert, awake, not in acute distress Cardiovascular: RRR, S1/S2 +, no rubs, no gallops Respiratory: CTA bilaterally, no wheezing, no rhonchi Abdominal: Soft, mild diffuse tenderness, bowel sounds + Extremities: no edema, no cyanosis    The results of significant diagnostics from this hospitalization (including imaging, microbiology, ancillary and laboratory) are listed below for reference.     Microbiology: No results found for this or any previous visit (from the past 240 hour(s)).   Labs: BNP (last 3 results) No results for input(s): BNP in the last 8760 hours. Basic Metabolic Panel: Recent Labs  Lab 01/29/18 0337  NA 138  K 3.7  CL 106  CO2 28  GLUCOSE 78  BUN 12  CREATININE 0.50  CALCIUM 9.1   Liver Function Tests: Recent Labs  Lab 01/29/18 0337  AST 16  ALT 12  ALKPHOS 60  BILITOT 0.7  PROT 7.6  ALBUMIN 4.2   Recent Labs  Lab 01/29/18 0337  LIPASE 43   No results for input(s): AMMONIA in the last 168 hours. CBC: Recent Labs  Lab 01/29/18 0337  WBC 5.5  NEUTROABS 2.7  HGB 6.7*  HCT 26.1*  MCV 58.8*  PLT 536*   Cardiac Enzymes: No results for input(s): CKTOTAL, CKMB, CKMBINDEX, TROPONINI in the last 168 hours. BNP: Invalid input(s): POCBNP CBG: No results for input(s): GLUCAP in the last 168 hours. D-Dimer No results for input(s): DDIMER in the last 72 hours. Hgb A1c  No results for input(s): HGBA1C in the last 72 hours. Lipid Profile No results for input(s): CHOL, HDL, LDLCALC, TRIG, CHOLHDL, LDLDIRECT in the last 72 hours. Thyroid function studies No results for input(s): TSH, T4TOTAL, T3FREE, THYROIDAB in the last 72 hours.  Invalid input(s): FREET3 Anemia work up Recent Labs    01/29/18 1051  VITAMINB12 395  FOLATE 13.1  FERRITIN 1*  TIBC 429  IRON 8*  RETICCTPCT 1.0   Urinalysis    Component Value Date/Time   COLORURINE YELLOW 01/29/2018 0447   APPEARANCEUR CLEAR 01/29/2018 0447   LABSPEC 1.036 (H) 01/29/2018 0447    PHURINE 7.0 01/29/2018 0447   GLUCOSEU NEGATIVE 01/29/2018 0447   HGBUR SMALL (A) 01/29/2018 0447   BILIRUBINUR NEGATIVE 01/29/2018 0447   KETONESUR NEGATIVE 01/29/2018 0447   PROTEINUR NEGATIVE 01/29/2018 0447   UROBILINOGEN 4.0 (H) 05/07/2014 1547   NITRITE NEGATIVE 01/29/2018 0447   LEUKOCYTESUR NEGATIVE 01/29/2018 0447   Sepsis Labs Invalid input(s): PROCALCITONIN,  WBC,  LACTICIDVEN Microbiology No results found for this or any previous visit (from the past 240 hour(s)).   Time coordinating discharge:  SIGNED:   Erick Blinks, MD  [facility] 01/29/2018, 2:39 PM Pager   If 7PM-7AM, please contact night-coverage [email or web address] Password [email or web address]

## 2018-01-29 NOTE — ED Triage Notes (Signed)
Pt c/o generalized abd pain that started tonight and has gotten worse during the night, denies any n/v, states that she has had two hernias in the past and this feels the same

## 2018-01-29 NOTE — Progress Notes (Signed)
Patient discharged to [facility] - family transporting.  1 unit of PRBCs complete. DC summary and disc of CT sent with patient.  Patient in NAD - no questions.  Assisted off unit with NT.  Report already called to receiving floor and expecting patient

## 2018-01-29 NOTE — Consult Note (Signed)
Reason for Consult: Abdominal pain Referring Physician: Dr. Corinna Capra is an 42 y.o. female.  HPI: Patient is a 42 year old white female status post multiple abdominal surgeries in the past including earlier gastric bypass and exploratory laparotomy for an internal hernia in 2018 who presents with acute onset of generalized abdominal pain.  She denies any nausea or vomiting.  She did have a mild bloody bowel movement early this morning.  She states her pain is crampy in nature.  It is similar to the previous abdominal pain that required exploratory laparotomy.  Her pain is 4 out of 10.  She denies any fevers.  Past Medical History:  Diagnosis Date  . Medical history non-contributory     Past Surgical History:  Procedure Laterality Date  . CESAREAN SECTION    . CESAREAN SECTION N/A 05/28/2014   Procedure: CESAREAN SECTION;  Surgeon: Mora Bellman, MD;  Location: [facility];  Service: Obstetrics;  Laterality: N/A;  . ESOPHAGOGASTRODUODENOSCOPY N/A 05/29/2014   Procedure: ESOPHAGOGASTRODUODENOSCOPY (EGD);  Surgeon: Alphonsa Overall, MD;  Location: [facility];  Service: General;  Laterality: N/A;  . GALLBLADDER SURGERY    . GASTRIC BYPASS    . HERNIA REPAIR    . LAPAROTOMY N/A 05/28/2014   Procedure: EXPLORATORY LAPAROTOMY;  Surgeon: Leighton Ruff, MD;  Location: [facility];  Service: General;  Laterality: N/A;  . LYSIS OF ADHESION N/A 05/28/2014   Procedure: LYSIS OF ADHESION and reduction of internal hernia ;  Surgeon: Leighton Ruff, MD;  Location: [facility];  Service: General;  Laterality: N/A;    Family History  Problem Relation Age of Onset  . Diabetes Mother   . Hypertension Father   . Heart disease Maternal Grandmother     Social History:  reports that she quit smoking about 4 years ago. Her smoking use included cigarettes. She has never used smokeless tobacco. She reports that she does not drink alcohol or use drugs.  Allergies:  Allergies  Allergen Reactions  . Sulfa Antibiotics  Rash    Medications: I have reviewed the patient's current medications.  Results for orders placed or performed during the hospital encounter of 01/29/18 (from the past 48 hour(s))  Comprehensive metabolic panel     Status: Abnormal   Collection Time: 01/29/18  3:37 AM  Result Value Ref Range   Sodium 138 135 - 145 mmol/L   Potassium 3.7 3.5 - 5.1 mmol/L   Chloride 106 98 - 111 mmol/L   CO2 28 22 - 32 mmol/L   Glucose, Bld 78 70 - 99 mg/dL   BUN 12 6 - 20 mg/dL   Creatinine, Ser 0.50 0.44 - 1.00 mg/dL   Calcium 9.1 8.9 - 10.3 mg/dL   Total Protein 7.6 6.5 - 8.1 g/dL   Albumin 4.2 3.5 - 5.0 g/dL   AST 16 15 - 41 U/L   ALT 12 0 - 44 U/L   Alkaline Phosphatase 60 38 - 126 U/L   Total Bilirubin 0.7 0.3 - 1.2 mg/dL   GFR calc non Af Amer >60 >60 mL/min   GFR calc Af Amer >60 >60 mL/min    Comment: (NOTE) The eGFR has been calculated using the CKD EPI equation. This calculation has not been validated in all clinical situations. eGFR's persistently <60 mL/min signify possible Chronic Kidney Disease.    Anion gap 4 (L) 5 - 15    Comment: Performed at [facility], [address] [place], [place] [address]  Lipase, blood     Status:  None   Collection Time: 01/29/18  3:37 AM  Result Value Ref Range   Lipase 43 11 - 51 U/L    Comment: Performed at [facility], [address], [place], [place] [address]  CBC with Differential     Status: Abnormal   Collection Time: 01/29/18  3:37 AM  Result Value Ref Range   WBC 5.5 4.0 - 10.5 K/uL   RBC 4.44 3.87 - 5.11 MIL/uL   Hemoglobin 6.7 (LL) 12.0 - 15.0 g/dL    Comment: RESULT REPEATED AND VERIFIED CRITICAL RESULT CALLED TO, READ BACK BY AND VERIFIED WITH: CRABTREE,B @ 0408 ON 01/29/18 BY JUW    HCT 26.1 (L) 36.0 - 46.0 %   MCV 58.8 (L) 78.0 - 100.0 fL   MCH 15.1 (L) 26.0 - 34.0 pg   MCHC 25.7 (L) 30.0 - 36.0 g/dL   RDW 19.8 (H) 11.5 - 15.5 %   Platelets 536 (H) 150 - 400 K/uL   Neutrophils Relative % 48 %   Neutro Abs 2.7 1.7 - 7.7  K/uL   Lymphocytes Relative 39 %   Lymphs Abs 2.1 0.7 - 4.0 K/uL   Monocytes Relative 9 %   Monocytes Absolute 0.5 0.1 - 1.0 K/uL   Eosinophils Relative 3 %   Eosinophils Absolute 0.2 0.0 - 0.7 K/uL   Basophils Relative 1 %   Basophils Absolute 0.0 0.0 - 0.1 K/uL    Comment: Performed at [facility], [address] [place], [place] [address]  I-Stat beta hCG blood, ED     Status: None   Collection Time: 01/29/18  3:49 AM  Result Value Ref Range   I-stat hCG, quantitative <5.0 <5 mIU/mL   Comment 3            Comment:   GEST. AGE      CONC.  (mIU/mL)   <=1 WEEK        5 - 50     2 WEEKS       50 - 500     3 WEEKS       100 - 10,000     4 WEEKS     1,000 - 30,000        FEMALE AND NON-PREGNANT FEMALE:     LESS THAN 5 mIU/mL   Urinalysis, Routine w reflex microscopic     Status: Abnormal   Collection Time: 01/29/18  4:47 AM  Result Value Ref Range   Color, Urine YELLOW YELLOW   APPearance CLEAR CLEAR   Specific Gravity, Urine 1.036 (H) 1.005 - 1.030   pH 7.0 5.0 - 8.0   Glucose, UA NEGATIVE NEGATIVE mg/dL   Hgb urine dipstick SMALL (A) NEGATIVE   Bilirubin Urine NEGATIVE NEGATIVE   Ketones, ur NEGATIVE NEGATIVE mg/dL   Protein, ur NEGATIVE NEGATIVE mg/dL   Nitrite NEGATIVE NEGATIVE   Leukocytes, UA NEGATIVE NEGATIVE   WBC, UA 0-5 0 - 5 WBC/hpf   Bacteria, UA NONE SEEN NONE SEEN   Squamous Epithelial / LPF 0-5 0 - 5    Comment: Performed at [facility], [address], [place], [place] [address]    Ct Abdomen Pelvis W Contrast  Result Date: 01/29/2018 CLINICAL DATA:  42 y/o F; generalized abdominal pain that started tonight and has gotten worse during the day. EXAM: CT ABDOMEN AND PELVIS WITH CONTRAST TECHNIQUE: Multidetector CT imaging of the abdomen and pelvis was performed using the standard protocol following bolus administration of intravenous contrast. CONTRAST:  120m ISOVUE-300 IOPAMIDOL (  ISOVUE-300) INJECTION 61% COMPARISON:  None. FINDINGS: Lower chest: No acute  abnormality. Hepatobiliary: Non-specific calcification within the right lobe of liver. No additional focal liver abnormality is seen. Status post cholecystectomy. Slight prominence of intra and extrahepatic bile ducts is likely compensatory post cholecystectomy. Pancreas: Unremarkable. No pancreatic ductal dilatation or surrounding inflammatory changes. Spleen: Normal in size without focal abnormality. Adrenals/Urinary Tract: Adrenal glands are unremarkable. Kidneys are normal, without renal calculi, focal lesion, or hydronephrosis. Bladder is unremarkable. Stomach/Bowel: Gastric bypass. Left upper quadrant anastomosis is patent. There are mildly dilated loops of small bowel in the left hemiabdomen with mesenteric edema and mild rotation of the mesentery (series 2 image 30-41). Vascular/Lymphatic: No significant vascular findings are present. No enlarged abdominal or pelvic lymph nodes. Reproductive: Uterus and bilateral adnexa are unremarkable. 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 Musculoskeletal: No fracture is seen. IMPRESSION: 1. Mild wall thickening and dilation of small bowel loops in left hemiabdomen with adjacent mesenteric edema. Findings may represent acute enteritis. Additionally, there is a mild mesenteric rotation in the left hemiabdomen suggesting differential of internal hernia or adhesion. 2. Postsurgical changes in the anterior abdominal wall with diastasis rectus and several small broad-based ventral hernias containing fat and elements of bowel without obstruction. Electronically Signed   By: Kristine Garbe M.D.   On: 01/29/2018 05:20    ROS:  Pertinent items are noted in HPI.  Blood pressure 96/64, pulse 73, temperature 97.8 F (36.6 C), temperature source Oral, resp. rate 19, height 5' (1.524 m), weight 72.6 kg, last menstrual period 01/24/2018, SpO2 100 %. Physical Exam:  Well-developed well-nourished white female no acute distress lying in the bed. Head is normocephalic, atraumatic Lungs clear to auscultation with equal breath sounds bilaterally Heart examination reveals regular rate and rhythm without S3, S4, murmurs Abdomen is soft with no point tenderness or rigidity.  There is discomfort is migratory in nature during palpation.  Several ventral hernias are present along the midline, though no incarcerated hernias are palpable.  Due to body habitus, it is difficult to fully assess her hernias.  CT scan images personally reviewed Assessment/Plan: Impression: Possible partial internal hernia with small bowel obstruction and possible early inflammatory changes of the bowel due to the hernia.  She does not have an acute abdomen requiring urgent surgical intervention at this time, though these changes are concerning for possible early ischemia.  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  She would like transfer back to [place] where the original surgery was performed.  I discussed this with Dr. Zeb Comfort, general surgery, in [facility] who has agreed to take the patient on transfer.  Patient and family refuse EMS transportation and would like to take her there by private vehicle.  As her condition is stable, this is fine by me.  Patient will be discharged once a bed is available at [facility].  We will give her 1 unit of packed red blood cells.  Unfortunately cannot place NG tube as patient will be discharged and transported by private motor vehicle.  Aviva Signs 01/29/2018, 10:36 AM

## 2018-01-29 NOTE — ED Provider Notes (Signed)
[facility] EMERGENCY DEPARTMENT Provider Note   CSN: [id number] Arrival date & time: 01/29/18  4098     History   Chief Complaint Chief Complaint  Patient presents with  . Abdominal Pain    HPI Barbara Burton is a 42 y.o. female.  Patient is a 42 year old female with past medical history of gastric bypass, prior hernia surgery x2, anemia.  She presents today for evaluation of abdominal pain.  Her pain is located in the middle of her stomach and described as crampy.  This started 2 days ago, but rapidly worsened this evening.  She feels nauseated but has not vomited.  She denies any diarrhea or constipation.  She denies any urinary complaints.  The history is provided by the patient.  Abdominal Pain   This is a new problem. The current episode started yesterday. The problem occurs constantly. The problem has been rapidly worsening. The pain is associated with an unknown factor. The pain is located in the periumbilical region. The quality of the pain is cramping. The pain is severe. Associated symptoms include nausea. Pertinent negatives include fever, hematochezia, vomiting and constipation. The symptoms are aggravated by certain positions and palpation. Nothing relieves the symptoms.    Past Medical History:  Diagnosis Date  . Medical history non-contributory     Patient Active Problem List   Diagnosis Date Noted  . Internal hernia 05/28/2014  . Pregnancy with generalized abdominal pain, antepartum   . [redacted] weeks gestation of pregnancy   . Maternal age 93+, multigravida, antepartum   . AMA (advanced maternal age) multigravida 35+   . Hx of cesarean section complicating pregnancy   . Hx of gastric bypass   . Previous gestational diabetes mellitus, antepartum   . ASCUS with positive high risk human papillomavirus of vagina 11/29/2013  . Supervision of high risk pregnancy, antepartum 11/28/2013  . History of gastric bypass 11/28/2013  . Obesity complicating pregnancy in second  trimester 11/28/2013  . Anemia affecting pregnancy, antepartum 10/19/2013    Past Surgical History:  Procedure Laterality Date  . CESAREAN SECTION    . CESAREAN SECTION N/A 05/28/2014   Procedure: CESAREAN SECTION;  Surgeon: Catalina Antigua, MD;  Location: [facility];  Service: Obstetrics;  Laterality: N/A;  . ESOPHAGOGASTRODUODENOSCOPY N/A 05/29/2014   Procedure: ESOPHAGOGASTRODUODENOSCOPY (EGD);  Surgeon: Ovidio Kin, MD;  Location: [facility];  Service: General;  Laterality: N/A;  . GALLBLADDER SURGERY    . GASTRIC BYPASS    . HERNIA REPAIR    . LAPAROTOMY N/A 05/28/2014   Procedure: EXPLORATORY LAPAROTOMY;  Surgeon: Romie Levee, MD;  Location: [facility];  Service: General;  Laterality: N/A;  . LYSIS OF ADHESION N/A 05/28/2014   Procedure: LYSIS OF ADHESION and reduction of internal hernia ;  Surgeon: Romie Levee, MD;  Location: [facility];  Service: General;  Laterality: N/A;     OB History    Gravida  5   Para  5   Term  5   Preterm      AB      Living  1     SAB      TAB      Ectopic      Multiple  0   Live Births  1            Home Medications    Prior to Admission medications   Medication Sig Start Date End Date Taking? Authorizing Provider  ACCU-CHEK FASTCLIX LANCETS MISC Inject 1 each into the skin 4 (four)  times daily. 161.09 for testing 4 times daily Patient not taking: Reported on 04/09/2014 12/10/13   Adam Phenix, MD  acetaminophen (TYLENOL) 500 MG tablet Take 1,000 mg by mouth every 4 (four) hours as needed for mild pain or moderate pain.     [provider]  BIOTIN PO Take 1 tablet by mouth daily.    [provider]  CALCIUM PO Take 1 tablet by mouth daily.    [provider]  Cyanocobalamin (VITAMIN B-12 PO) Take 1 tablet by mouth daily.    [provider]  famotidine (PEPCID) 20 MG tablet Take 1 tablet (20 mg total) by mouth 2 (two) times daily. Patient not taking: Reported on 04/09/2014 11/11/13   Archie Patten, CNM  ferrous sulfate (FERROUSUL) 325 (65 FE) MG tablet Take 1 tablet (325 mg total) by mouth 3 (three) times daily with meals. Patient not taking: Reported on 06/13/2014 10/30/13   Tereso Newcomer, MD  fluconazole (DIFLUCAN) 200 MG tablet Take 2 tablets the first day followed by 1 tablet daily for 14 days 06/13/14   Constant, Peggy, MD  glucose blood (ACCU-CHEK SMARTVIEW) test strip One strip 4 times daily. Patient not taking: Reported on 04/09/2014 12/11/13   Poe, Deirdre C, CNM  Norethindrone Acetate-Ethinyl Estrad-FE (LOESTRIN 24 FE) 1-20 MG-MCG(24) tablet Take 1 tablet by mouth daily. 10/21/14   [name], Jethro Bastos, MD  ondansetron (ZOFRAN) 4 MG tablet Take 1 tablet (4 mg total) by mouth every 6 (six) hours as needed for nausea. Patient not taking: Reported on 06/13/2014 06/02/14   Harriette Bouillon, MD  oxyCODONE-acetaminophen (PERCOCET/ROXICET) 5-325 MG per tablet Take 1 tablet by mouth every 4 (four) hours as needed for moderate pain. Patient not taking: Reported on 06/13/2014 06/02/14   Harriette Bouillon, MD  Prenatal Vit-Fe Fumarate-FA (PRENATAL MULTIVITAMIN) TABS tablet Take 1 tablet by mouth daily at 12 noon.    [provider]    Family History Family History  Problem Relation Age of Onset  . Diabetes Mother   . Hypertension Father   . Heart disease Maternal Grandmother     Social History Social History   Tobacco Use  . Smoking status: Former Smoker    Types: Cigarettes    Last attempt to quit: 10/26/2013    Years since quitting: 4.2  . Smokeless tobacco: Never Used  Substance Use Topics  . Alcohol use: No  . Drug use: No     Allergies   Sulfa antibiotics   Review of Systems Review of Systems  Constitutional: Negative for fever.  Gastrointestinal: Positive for abdominal pain and nausea. Negative for constipation, hematochezia and vomiting.  All other systems reviewed and are negative.    Physical Exam Updated Vital Signs BP 123/72 (BP Location: Left Arm)    Pulse 84   Temp 97.6 F (36.4 C) (Oral)   Resp 20   Ht 5' (1.524 m)   Wt 72.6 kg   LMP 01/24/2018   SpO2 100%   BMI 31.25 kg/m   Physical Exam  Constitutional: She is oriented to person, place, and time. She appears well-developed and well-nourished. No distress.  HENT:  Head: Normocephalic and atraumatic.  Neck: Normal range of motion. Neck supple.  Cardiovascular: Normal rate and regular rhythm. Exam reveals no gallop and no friction rub.  No murmur heard. Pulmonary/Chest: Effort normal and breath sounds normal. No respiratory distress. She has no wheezes.  Abdominal: Soft. Bowel sounds are normal. She exhibits no distension. There is tenderness in  the epigastric area and periumbilical area. There is no rigidity, no rebound and no guarding.  Musculoskeletal: Normal range of motion.  Neurological: She is alert and oriented to person, place, and time.  Skin: Skin is warm and dry. She is not diaphoretic.  Nursing note and vitals reviewed.    ED Treatments / Results  Labs (all labs ordered are listed, but only abnormal results are displayed) Labs Reviewed - No data to display  EKG None  Radiology No results found.  Procedures Procedures (including critical care time)  Medications Ordered in ED Medications  ondansetron (ZOFRAN) injection 4 mg (has no administration in time range)  morphine 4 MG/ML injection 4 mg (has no administration in time range)  sodium chloride 0.9 % bolus 1,000 mL (has no administration in time range)     Initial Impression / Assessment and Plan / ED Course  I have reviewed the triage vital signs and the nursing notes.  Pertinent labs & imaging results that were available during my care of the patient were reviewed by me and considered in my medical decision making (see chart for details).  CT scan shows 2 possible sources for the patient's discomfort.  1 is an area of small bowel that is consistent with enteritis, the other shows mild  mesenteric rotation suggesting possible internal hernia or adhesion.  These findings were discussed with Dr. Lovell SheehanJenkins who would like the patient admitted for pain control and observation.  I have spoken with Dr. Sharl MaLama who agrees to admit.  Final Clinical Impressions(s) / ED Diagnoses   Final diagnoses:  None    ED Discharge Orders    None       Geoffery Lyonselo, Opel Lejeune, MD 01/29/18 [phone]

## 2018-01-29 NOTE — H&P (Signed)
History and Physical    Barbara Burton:[id number] DOB: 1976/01/14 DOA: 01/29/2018  PCP: Patient, No Pcp Per  Patient coming from: home  I have personally briefly reviewed patient's old medical records in [facility]  Chief Complaint: abdominal pain  HPI: Barbara Burton is a 42 y.o. female with medical history significant of gastric bypass surgery who presents to the emergency room with complaints of pain.  Symptoms started yesterday evening.  She has diffuse abdominal pain.  She had noticed some bright red blood in her stools this morning.  She has not had any vomiting.  No shortness of breath or cough.  She feels her current symptoms are similar to previous episode where she was found to have mesenteric ischemia.  ED Course: She was evaluated in the emergency room her vitals were noted to be stable.  Hemoglobin was low at 6.7.  Baseline hemoglobin appears to be about 7.  CT of the abdomen pelvis indicated mild wall thickening and dilatation of small bowel loops in the left hemiabdomen with adjacent mesenteric edema.  There is also mild mesenteric rotation of the left hemiabdomen suggesting differential of internal hernia or adhesion.  Review of Systems: As per HPI otherwise 10 point review of systems negative.    Past Medical History:  Diagnosis Date  . Medical history non-contributory     Past Surgical History:  Procedure Laterality Date  . CESAREAN SECTION    . CESAREAN SECTION N/A 05/28/2014   Procedure: CESAREAN SECTION;  Surgeon: Catalina Antigua, MD;  Location: [facility];  Service: Obstetrics;  Laterality: N/A;  . ESOPHAGOGASTRODUODENOSCOPY N/A 05/29/2014   Procedure: ESOPHAGOGASTRODUODENOSCOPY (EGD);  Surgeon: Ovidio Kin, MD;  Location: [facility];  Service: General;  Laterality: N/A;  . GALLBLADDER SURGERY    . GASTRIC BYPASS    . HERNIA REPAIR    . LAPAROTOMY N/A 05/28/2014   Procedure: EXPLORATORY LAPAROTOMY;  Surgeon: Romie Levee, MD;  Location: [facility];  Service:  General;  Laterality: N/A;  . LYSIS OF ADHESION N/A 05/28/2014   Procedure: LYSIS OF ADHESION and reduction of internal hernia ;  Surgeon: Romie Levee, MD;  Location: [facility];  Service: General;  Laterality: N/A;    Social History:  reports that she quit smoking about 4 years ago. Her smoking use included cigarettes. She has never used smokeless tobacco. She reports that she does not drink alcohol or use drugs.  Allergies  Allergen Reactions  . Sulfa Antibiotics Rash    Family History  Problem Relation Age of Onset  . Diabetes Mother   . Hypertension Father   . Heart disease Maternal Grandmother      Prior to Admission medications   Medication Sig Start Date End Date Taking? Authorizing Provider  ACCU-CHEK FASTCLIX LANCETS MISC Inject 1 each into the skin 4 (four) times daily. 811.91 for testing 4 times daily Patient not taking: Reported on 04/09/2014 12/10/13   Adam Phenix, MD  acetaminophen (TYLENOL) 500 MG tablet Take 1,000 mg by mouth every 4 (four) hours as needed for mild pain or moderate pain.     [provider]  BIOTIN PO Take 1 tablet by mouth daily.    [provider]  CALCIUM PO Take 1 tablet by mouth daily.    [provider]  Cyanocobalamin (VITAMIN B-12 PO) Take 1 tablet by mouth daily.    [provider]  famotidine (PEPCID) 20 MG tablet Take 1 tablet (20 mg total) by mouth 2 (two) times daily. Patient  not taking: Reported on 04/09/2014 11/11/13   Archie PattenFrazier, Natalie K, CNM  ferrous sulfate (FERROUSUL) 325 (65 FE) MG tablet Take 1 tablet (325 mg total) by mouth 3 (three) times daily with meals. Patient not taking: Reported on 06/13/2014 10/30/13   Tereso NewcomerAnyanwu, [name], MD  fluconazole (DIFLUCAN) 200 MG tablet Take 2 tablets the first day followed by 1 tablet daily for 14 days 06/13/14   Constant, Peggy, MD  glucose blood (ACCU-CHEK SMARTVIEW) test strip One strip 4 times daily. Patient not taking: Reported on 04/09/2014 12/11/13   Poe, Deirdre C,  CNM  Norethindrone Acetate-Ethinyl Estrad-FE (LOESTRIN 24 FE) 1-20 MG-MCG(24) tablet Take 1 tablet by mouth daily. 10/21/14   [name], Jethro BastosUgonna A, MD  ondansetron (ZOFRAN) 4 MG tablet Take 1 tablet (4 mg total) by mouth every 6 (six) hours as needed for nausea. Patient not taking: Reported on 06/13/2014 06/02/14   Harriette Bouillonornett, Thomas, MD  oxyCODONE-acetaminophen (PERCOCET/ROXICET) 5-325 MG per tablet Take 1 tablet by mouth every 4 (four) hours as needed for moderate pain. Patient not taking: Reported on 06/13/2014 06/02/14   Harriette Bouillonornett, Thomas, MD  Prenatal Vit-Fe Fumarate-FA (PRENATAL MULTIVITAMIN) TABS tablet Take 1 tablet by mouth daily at 12 noon.    [provider]    Physical Exam: Vitals:   01/29/18 0330 01/29/18 0331 01/29/18 0635  BP: 123/72 123/72 96/64  Pulse: 89 84 73  Resp:  20 19  Temp:  97.6 F (36.4 C) 97.8 F (36.6 C)  TempSrc:  Oral Oral  SpO2: 97% 100% 100%  Weight: 72.6 kg    Height: 5' (1.524 m)      Constitutional: NAD, calm, comfortable Eyes: PERRL, lids and conjunctivae normal ENMT: Mucous membranes are moist. Posterior pharynx clear of any exudate or lesions.Normal dentition.  Neck: normal, supple, no masses, no thyromegaly Respiratory: clear to auscultation bilaterally, no wheezing, no crackles. Normal respiratory effort. No accessory muscle use.  Cardiovascular: Regular rate and rhythm, no murmurs / rubs / gallops. No extremity edema. 2+ pedal pulses. No carotid bruits.  Abdomen: soft, diffuse mild tenderness, no masses palpated. No hepatosplenomegaly. Bowel sounds positive.  Musculoskeletal: no clubbing / cyanosis. No joint deformity upper and lower extremities. Good ROM, no contractures. Normal muscle tone.  Skin: no rashes, lesions, ulcers. No induration Neurologic: CN 2-12 grossly intact. Sensation intact, DTR normal. Strength 5/5 in all 4.  Psychiatric: Normal judgment and insight. Alert and oriented x 3. Normal mood.    Labs on Admission: I have  personally reviewed following labs and imaging studies  CBC: Recent Labs  Lab 01/29/18 0337  WBC 5.5  NEUTROABS 2.7  HGB 6.7*  HCT 26.1*  MCV 58.8*  PLT 536*   Basic Metabolic Panel: Recent Labs  Lab 01/29/18 0337  NA 138  K 3.7  CL 106  CO2 28  GLUCOSE 78  BUN 12  CREATININE 0.50  CALCIUM 9.1   GFR: Estimated Creatinine Clearance: 83.1 mL/min (by C-G formula based on SCr of 0.5 mg/dL). Liver Function Tests: Recent Labs  Lab 01/29/18 0337  AST 16  ALT 12  ALKPHOS 60  BILITOT 0.7  PROT 7.6  ALBUMIN 4.2   Recent Labs  Lab 01/29/18 0337  LIPASE 43   No results for input(s): AMMONIA in the last 168 hours. Coagulation Profile: No results for input(s): INR, PROTIME in the last 168 hours. Cardiac Enzymes: No results for input(s): CKTOTAL, CKMB, CKMBINDEX, TROPONINI in the last 168 hours. BNP (last 3 results) No results for input(s): PROBNP in the  last 8760 hours. HbA1C: No results for input(s): HGBA1C in the last 72 hours. CBG: No results for input(s): GLUCAP in the last 168 hours. Lipid Profile: No results for input(s): CHOL, HDL, LDLCALC, TRIG, CHOLHDL, LDLDIRECT in the last 72 hours. Thyroid Function Tests: No results for input(s): TSH, T4TOTAL, FREET4, T3FREE, THYROIDAB in the last 72 hours. Anemia Panel: No results for input(s): VITAMINB12, FOLATE, FERRITIN, TIBC, IRON, RETICCTPCT in the last 72 hours. Urine analysis:    Component Value Date/Time   COLORURINE YELLOW 01/29/2018 0447   APPEARANCEUR CLEAR 01/29/2018 0447   LABSPEC 1.036 (H) 01/29/2018 0447   PHURINE 7.0 01/29/2018 0447   GLUCOSEU NEGATIVE 01/29/2018 0447   HGBUR SMALL (A) 01/29/2018 0447   BILIRUBINUR NEGATIVE 01/29/2018 0447   KETONESUR NEGATIVE 01/29/2018 0447   PROTEINUR NEGATIVE 01/29/2018 0447   UROBILINOGEN 4.0 (H) 05/07/2014 1547   NITRITE NEGATIVE 01/29/2018 0447   LEUKOCYTESUR NEGATIVE 01/29/2018 0447    Radiological Exams on Admission: Ct Abdomen Pelvis W  Contrast  Result Date: 01/29/2018 CLINICAL DATA:  41 y/o F; generalized abdominal pain that started tonight and has gotten worse during the day. EXAM: CT ABDOMEN AND PELVIS WITH CONTRAST TECHNIQUE: Multidetector CT imaging of the abdomen and pelvis was performed using the standard protocol following bolus administration of intravenous contrast. CONTRAST:  ISOVUE-300 IOPAMIDOL (ISOVUE-300) INJECTION 61% COMPARISON:  None. FINDINGS: Lower chest: No acute abnormality. Hepatobiliary: Non-specific calcification within the right lobe of liver. No additional focal liver abnormality is seen. Status post cholecystectomy. Slight prominence of intra and extrahepatic bile ducts is likely compensatory post cholecystectomy. Pancreas: Unremarkable. No pancreatic ductal dilatation or surrounding inflammatory changes. Spleen: Normal in size without focal abnormality. Adrenals/Urinary Tract: Adrenal glands are unremarkable. Kidneys are normal, without renal calculi, focal lesion, or hydronephrosis. Bladder is unremarkable. Stomach/Bowel: Gastric bypass. Left upper quadrant anastomosis is patent. There are mildly dilated loops of small bowel in the left hemiabdomen with mesenteric edema and mild rotation of the mesentery (series 2 image [id number]). Vascular/Lymphatic: No significant vascular findings are present. No enlarged abdominal or pelvic lymph nodes. Reproductive: Uterus and bilateral adnexa are unremarkable. 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 Musculoskeletal: No fracture is seen. IMPRESSION: 1. Mild wall thickening and dilation of small bowel loops in left hemiabdomen with adjacent mesenteric edema. Findings may represent acute enteritis. Additionally, there is a mild mesenteric rotation in the left hemiabdomen suggesting differential of internal hernia or adhesion. 2. Postsurgical changes in the anterior  abdominal wall with diastasis rectus and several small broad-based ventral hernias containing fat and elements of bowel without obstruction. Electronically Signed   By: Mitzi Hansen M.D.   On: 01/29/2018 05:20    Assessment/Plan Active Problems:   History of gastric bypass   Enteritis   Abdominal pain   Microcytic anemia     1. Abdominal pain.  Concern for underlying enteritis versus internal hernia versus bowel ischemia.  Start the patient on intravenous antibiotics.  Continue pain management.  Seen by general surgery, Dr. Lovell Sheehan who has discussed her care with her primary surgeon hypertension at [facility].  Dr. Annita Brod at [facility] has accepted the patient in transfer for further care. 2. Microcytic anemia.  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  She also reports noncompliance with iron supplementation.  She also has been having some blood in her stool.  We will check anemia panel.  Transfuse 1 unit of PRBC.  DVT prophylaxis: SCDs Code Status: Full code Family Communication: Discussed with family at the bedside Disposition Plan: Transfer to [facility] once bed is available Consults called: General surgery, Dr. Lovell Sheehan Admission status: Observation  Erick Blinks MD [facility] Pager [phone]  If 7PM-7AM, please contact night-coverage [email or web address] Password [facility]  01/29/2018, 11:08 AM

## 2018-01-30 LAB — BPAM RBC
Blood Product Expiration Date: 201910042359
ISSUE DATE / TIME: 201909221333
UNIT TYPE AND RH: 6200

## 2018-01-30 LAB — TYPE AND SCREEN
ABO/RH(D): A POS
ANTIBODY SCREEN: NEGATIVE
Unit division: 0

## 2018-01-31 LAB — HIV ANTIBODY (ROUTINE TESTING W REFLEX): HIV Screen 4th Generation wRfx: NONREACTIVE

## 2020-03-04 IMAGING — CT CT ABD-PELV W/ CM
2 of 5 series · 16 of 46 positions shown, 18 images · IV contrast (Isovue)
Comparison: None.

CLINICAL DATA: 42 y/o F; generalized abdominal pain that started
tonight and has gotten worse during the day.

EXAM:
CT ABDOMEN AND PELVIS WITH CONTRAST
TECHNIQUE: Multidetector CT imaging of the abdomen and pelvis was performed
using the standard protocol following bolus administration of
intravenous contrast.
CONTRAST:  100mL 2IGGTS-3UU IOPAMIDOL (2IGGTS-3UU) INJECTION 61%

[Series 2: axial st · axial · 0.77mm/px · z∈[-629,-254]mm · 13 of 85 slices shown, 15 images]
[im 5/85  soft-tissue]
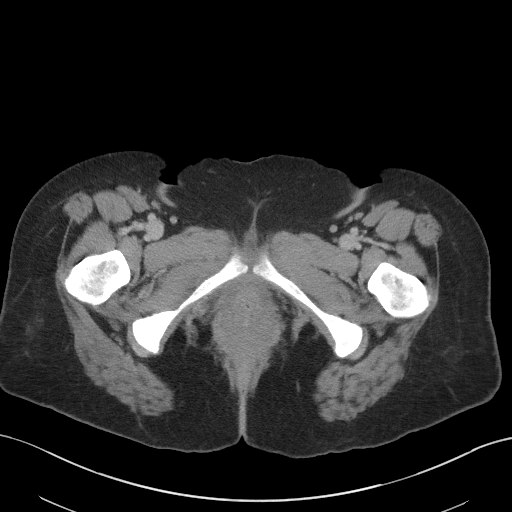
[im 5/85  bone]
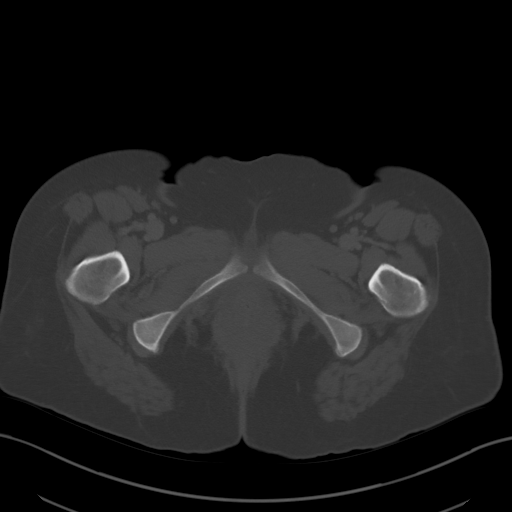
[im 14/85  soft-tissue]
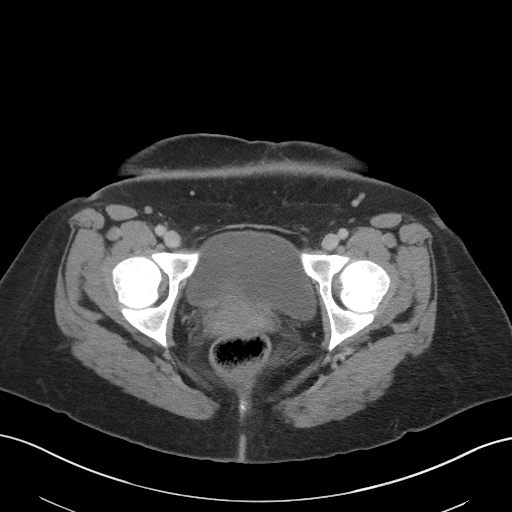
[im 18/85  soft-tissue]
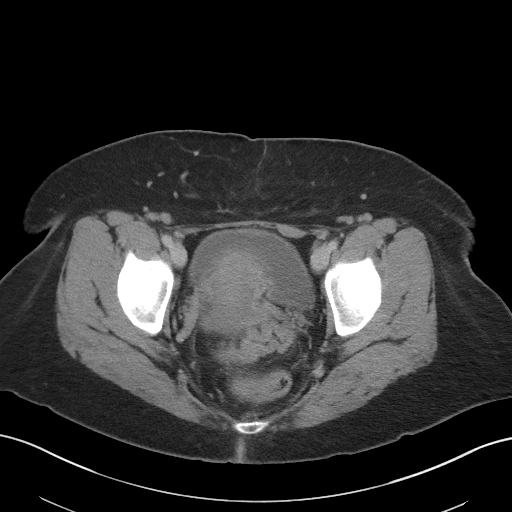
[im 23/85  soft-tissue]
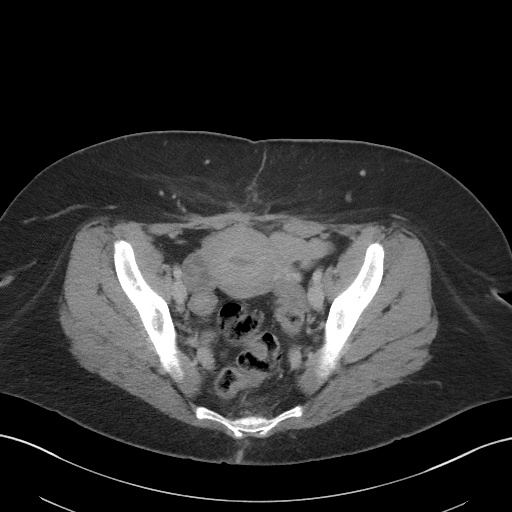
[im 31/85  soft-tissue]
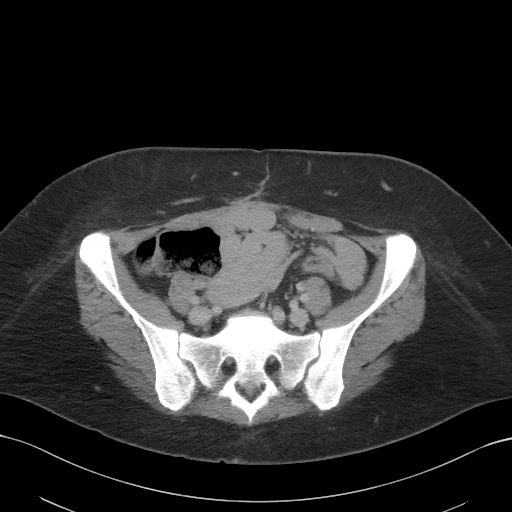
[im 36/85  soft-tissue]
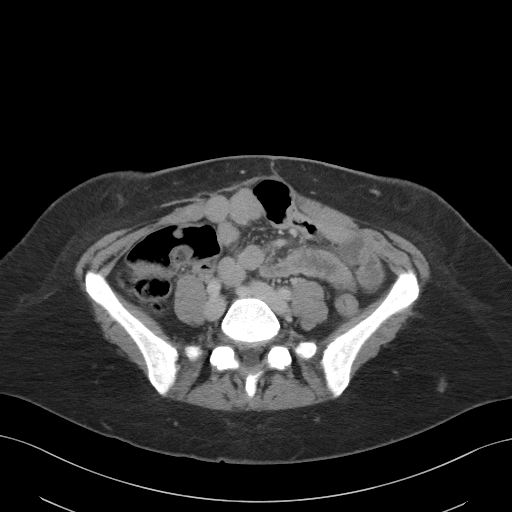
[im 45/85  soft-tissue]
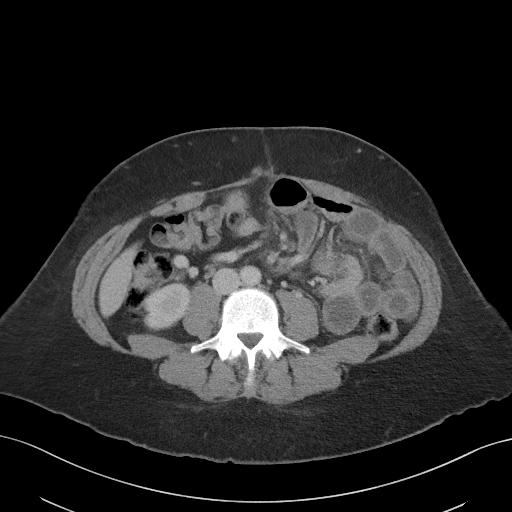
[im 49/85  soft-tissue]
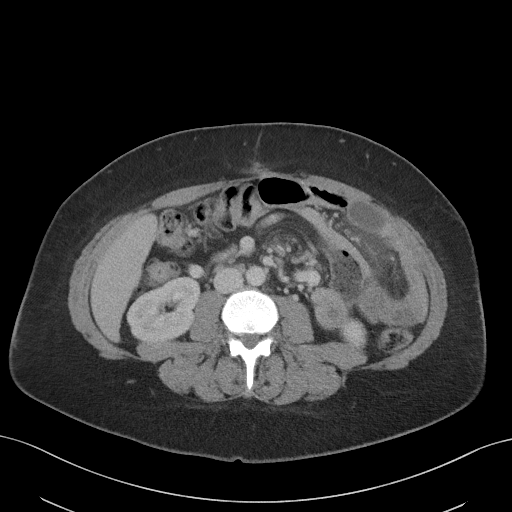
[im 54/85  soft-tissue]
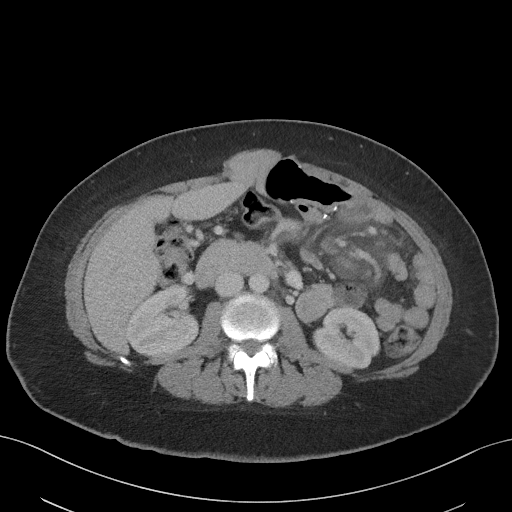
[im 54/85  bone]
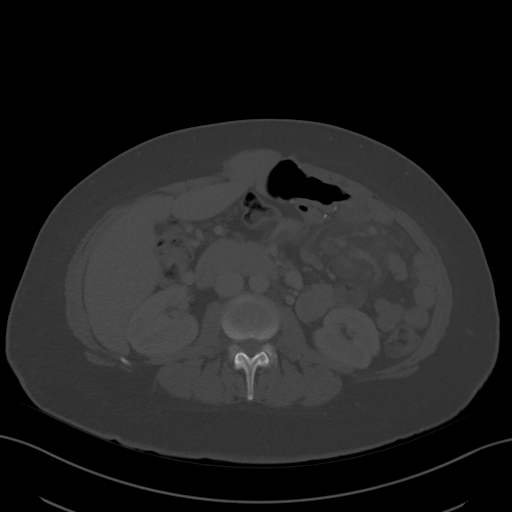
[im 62/85  soft-tissue]
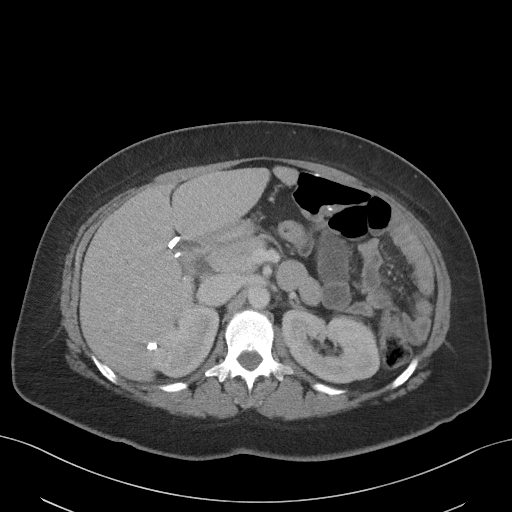
[im 67/85  soft-tissue]
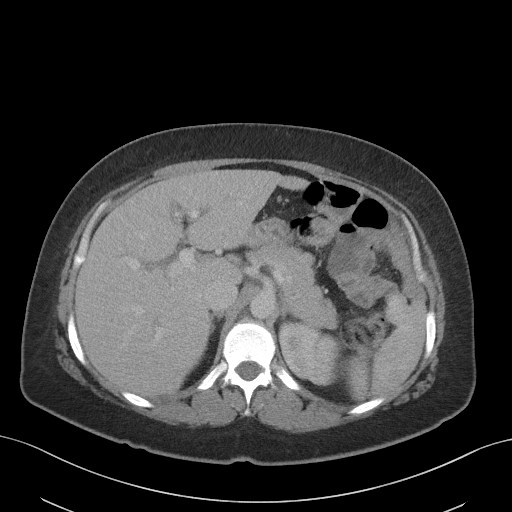
[im 71/85  soft-tissue]
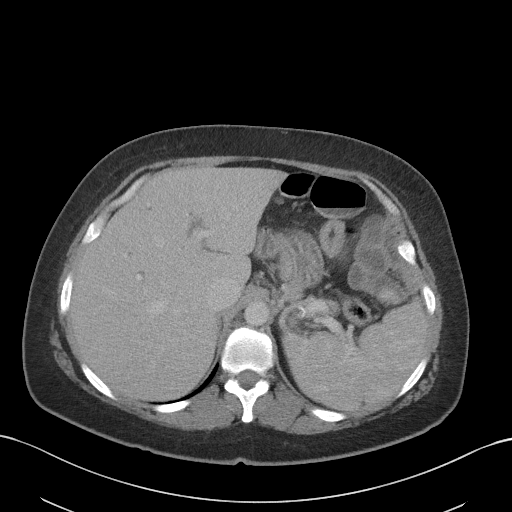
[im 80/85  soft-tissue]
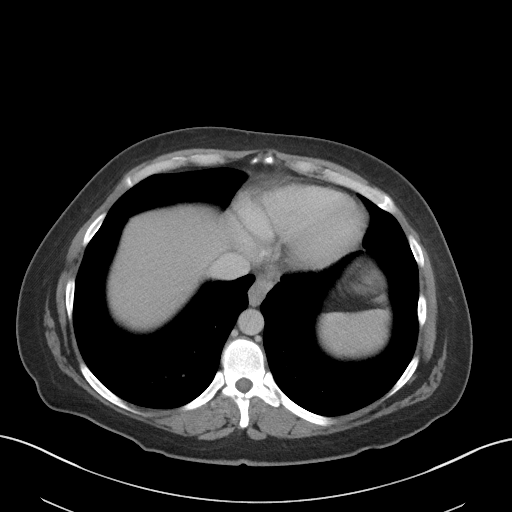

[Series 5: coronal st · coronal · 0.83mm/px · 3 of 95 slices shown]
[im 32/95  soft-tissue]
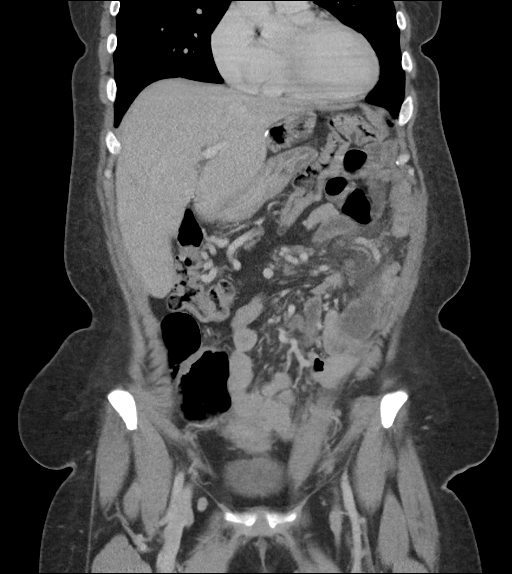
[im 42/95  soft-tissue]
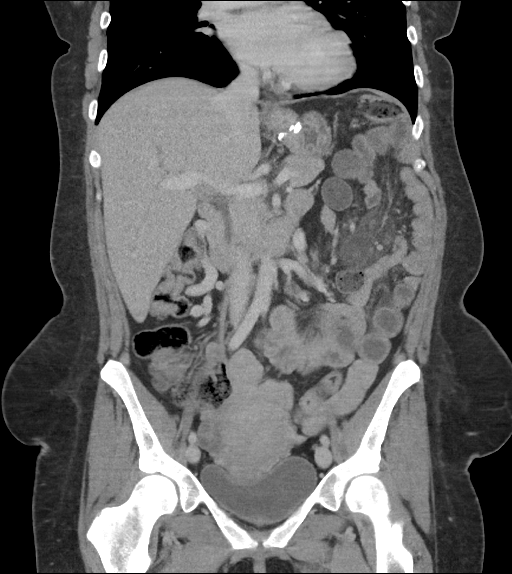
[im 53/95  soft-tissue]
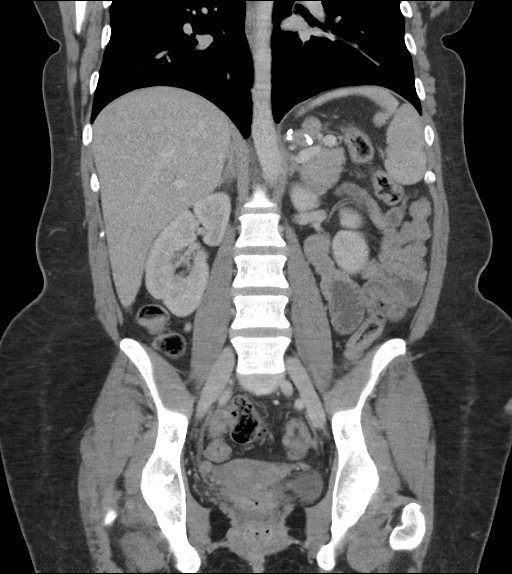

[16 of 46 positions shown; findings below may reference images not displayed]

FINDINGS: Lower chest: No acute abnormality.

Hepatobiliary: Non-specific calcification within the right lobe of
liver. No additional focal liver abnormality is seen. Status post
cholecystectomy. Slight prominence of intra and extrahepatic bile
ducts is likely compensatory post cholecystectomy.

Pancreas: Unremarkable. No pancreatic ductal dilatation or
surrounding inflammatory changes.

Spleen: Normal in size without focal abnormality.

Adrenals/Urinary Tract: Adrenal glands are unremarkable. Kidneys are
normal, without renal calculi, focal lesion, or hydronephrosis.
Bladder is unremarkable.

Stomach/Bowel: Gastric bypass. Left upper quadrant anastomosis is
patent. There are mildly dilated loops of small bowel in the left
hemiabdomen with mesenteric edema and mild rotation of the mesentery
(series 2 image 30-41).

Vascular/Lymphatic: No significant vascular findings are present. No
enlarged abdominal or pelvic lymph nodes.

Reproductive: Uterus and bilateral adnexa are unremarkable.

Other: Diastases rectus with several body based small ventral
abdominal hernias containing fat and elements of bowel without
obstructive changes.

Musculoskeletal: No fracture is seen.
IMPRESSION: 1. Mild wall thickening and dilation of small bowel loops in left
hemiabdomen with adjacent mesenteric edema. Findings may represent
acute enteritis. Additionally, there is a mild mesenteric rotation
in the left hemiabdomen suggesting differential of internal hernia
or adhesion.
2. Postsurgical changes in the anterior abdominal wall with
diastasis rectus and several small broad-based ventral hernias
containing fat and elements of bowel without obstruction.

By: Savio Locklear M.D.

## 2023-12-08 ENCOUNTER — Encounter (HOSPITAL_COMMUNITY): Payer: Self-pay

## 2023-12-08 ENCOUNTER — Emergency Department (HOSPITAL_COMMUNITY): Payer: Self-pay

## 2023-12-08 ENCOUNTER — Emergency Department (HOSPITAL_COMMUNITY)
Admission: EM | Admit: 2023-12-08 | Discharge: 2023-12-08 | Disposition: A | Discharge status: Home / Self Care | Payer: Self-pay | Attending: Emergency Medicine | Admitting: Emergency Medicine

## 2023-12-08 DIAGNOSIS — X509XXA Other and unspecified overexertion or strenuous movements or postures, initial encounter: Secondary | ICD-10-CM | POA: Insufficient documentation

## 2023-12-08 DIAGNOSIS — S93492A Sprain of other ligament of left ankle, initial encounter: Secondary | ICD-10-CM | POA: Insufficient documentation

## 2023-12-08 MED ORDER — IBUPROFEN 600 MG PO TABS: 600.0000 mg | ORAL_TABLET | Freq: Four times a day (QID) | ORAL | 0 refills | Status: AC | PRN

## 2023-12-08 MED ORDER — IBUPROFEN 400 MG PO TABS
600.0000 mg | ORAL_TABLET | Freq: Once | ORAL | Status: AC
  Administered 2023-12-08: 600 mg via ORAL
  Filled 2023-12-08: qty 2

## 2023-12-08 NOTE — ED Notes (Signed)
 Patient transported to CT

## 2023-12-08 NOTE — ED Provider Notes (Signed)
 [place] [facility]MENT AT [facility] Provider Note   CSN: [id number] Arrival date & time: 12/08/23  1501     Patient presents with: Fall and Foot Injury (left)   Barbara Burton is Burton 48 y.o. female who presents with left lateral ankle pain after having missed Burton step, rolling onto the left ankle with immediate sharp pain to the lateral left ankle.  She admits that she is unable to bear any weight on the left lower extremity secondary to pain.    Fall  Foot Injury      Prior to Admission medications   Medication Sig Start Date End Date Taking? Authorizing Provider  acetaminophen  (TYLENOL ) 500 MG tablet Take 1,000 mg by mouth every 4 (four) hours as needed for mild pain or moderate pain.     [provider]  ferrous sulfate  (FERROUSUL) 325 (65 FE) MG tablet Take 1 tablet (325 mg total) by mouth 3 (three) times daily with meals. Patient not taking: Reported on 06/13/2014 10/30/13   [name], [name], MD    Allergies: Sulfa antibiotics    Review of Systems  Musculoskeletal:  Positive for arthralgias and joint swelling.  All other systems reviewed and are negative.   Updated Vital Signs BP 121/74 (BP Location: Right Arm)   Pulse 84   Temp 98.1 F (36.7 C) (Oral)   Resp 18   Ht 5' (1.524 m)   Wt 70.8 kg   SpO2 95%   BMI 30.47 kg/m   Physical Exam Vitals and nursing note reviewed.  Constitutional:      General: She is not in acute distress.    Appearance: Normal appearance. She is well-developed.  HENT:     Head: Normocephalic and atraumatic.     Mouth/Throat:     Mouth: Mucous membranes are moist.     Pharynx: Oropharynx is clear.  Eyes:     Extraocular Movements: Extraocular movements intact.     Conjunctiva/sclera: Conjunctivae normal.     Pupils: Pupils are equal, round, and reactive to light.  Cardiovascular:     Rate and Rhythm: Normal rate and regular rhythm.     Pulses: Normal pulses.     Heart sounds: Normal heart sounds. No  murmur heard.    No friction rub. No gallop.  Pulmonary:     Effort: Pulmonary effort is normal. No respiratory distress.     Breath sounds: Normal breath sounds.  Abdominal:     General: Abdomen is flat. Bowel sounds are normal.     Palpations: Abdomen is soft.     Tenderness: There is no abdominal tenderness.  Musculoskeletal:        General: No swelling. Normal range of motion.     Cervical back: Normal range of motion and neck supple.     Right ankle: Normal.     Left ankle: Swelling and ecchymosis present. Tenderness present over the ATF ligament.  Skin:    General: Skin is warm and dry.     Capillary Refill: Capillary refill takes less than 2 seconds.  Neurological:     General: No focal deficit present.     Mental Status: She is alert. Mental status is at baseline.  Psychiatric:        Mood and Affect: Mood normal.     (all labs ordered are listed, but only abnormal results are displayed) Labs Reviewed - No data to display  EKG: None  Radiology: DG Foot Complete Left Result Date: 12/08/2023 CLINICAL DATA:  Status post fall EXAM: LEFT FOOT - COMPLETE 3+ VIEW COMPARISON:  Foot radiograph FINDINGS: There is no evidence of fracture or dislocation. Accessory os supra naviculare identified. Degenerative changes of the talonavicular and talocalcaneal joints. Sclerotic lesion along the medial cortex of the second metatarsal bone nonspecific likely bone island. Calcaneal spur identified. Mild soft tissue swelling around the ankle joint. IMPRESSION: No fracture or dislocation. Degenerative changes of the intertarsal joints. Calcaneal spur. Electronically Signed   By: Megan  Zare M.D.   On: 12/08/2023 18:18   DG Ankle Complete Left Result Date: 12/08/2023 CLINICAL DATA:  Left ankle pain at after fall. EXAM: LEFT ANKLE COMPLETE - 3+ VIEW COMPARISON:  None Available. FINDINGS: There is no evidence of fracture, dislocation, or joint effusion. There is no evidence of arthropathy or other  focal bone abnormality. Soft tissues are unremarkable. IMPRESSION: Negative. Electronically Signed   By: Lynwood Landy Raddle M.D.   On: 12/08/2023 18:03     Procedures   Medications Ordered in the ED  ibuprofen  (ADVIL ) tablet 600 mg (has no administration in time range)                                    Medical Decision Making Amount and/or Complexity of Data Reviewed Radiology: ordered.  Risk Prescription drug management.   Medical Decision Making:   Barbara Burton is Burton 48 y.o. female who presented to the ED today with left ankle pain detailed above.     Complete initial physical exam performed, notably the patient  was alert and oriented in no apparent distress.  Evaluation of the left ankle shows ecchymosis and swelling as well as tenderness to the left lateral ankle.    Reviewed and confirmed nursing documentation for past medical history, family history, social history.    Initial Assessment:   With the patient's presentation of left ankle pain, most likely diagnosis is sprain of the left ankle. Other diagnoses were considered including (but not limited to) acute fracture. These are considered less likely due to history of present illness and physical exam findings.     Initial Plan:  Obtain imaging of the left ankle and foot Objective evaluation as below reviewed   Initial Study Results:   Radiology:  All images reviewed independently. Agree with radiology report at this time.   DG Foot Complete Left Result Date: 12/08/2023 CLINICAL DATA:  Status post fall EXAM: LEFT FOOT - COMPLETE 3+ VIEW COMPARISON:  Foot radiograph FINDINGS: There is no evidence of fracture or dislocation. Accessory os supra naviculare identified. Degenerative changes of the talonavicular and talocalcaneal joints. Sclerotic lesion along the medial cortex of the second metatarsal bone nonspecific likely bone island. Calcaneal spur identified. Mild soft tissue swelling around the ankle joint. IMPRESSION: No  fracture or dislocation. Degenerative changes of the intertarsal joints. Calcaneal spur. Electronically Signed   By: Megan  Zare M.D.   On: 12/08/2023 18:18   DG Ankle Complete Left Result Date: 12/08/2023 CLINICAL DATA:  Left ankle pain at after fall. EXAM: LEFT ANKLE COMPLETE - 3+ VIEW COMPARISON:  None Available. FINDINGS: There is no evidence of fracture, dislocation, or joint effusion. There is no evidence of arthropathy or other focal bone abnormality. Soft tissues are unremarkable. IMPRESSION: Negative. Electronically Signed   By: Lynwood Landy Raddle M.D.   On: 12/08/2023 18:03      Reassessment and Plan:   After assessing patient and reviewing the  imaging, believe this patient has Burton lateral left ankle sprain, likely the anterior talofibular ligament.  As such we will place Burton ankle brace on the left ankle, manage pain with NSAIDs and Tylenol , have her follow-up with orthopedics within the next 2 weeks for reevaluation.  Care plan explained to the patient, she understands agrees has no further concerns at this time.       Final diagnoses:  Sprain of anterior talofibular ligament of left ankle, initial encounter    ED Discharge Orders     None          Myriam Dorn BROCKS, [place] 12/08/23 AMOS Suzette Pac, MD 12/09/23 1510

## 2023-12-08 NOTE — Discharge Instructions (Addendum)

## 2023-12-08 NOTE — ED Triage Notes (Signed)
 Pt comes in after a fall. Pt believes she stepped on her left outer foot wrong. Some swelling is noticeable in the area. Pt is unable to bear weight. A&Ox4.

## 2024-05-31 ENCOUNTER — Encounter: Payer: Self-pay | Admitting: Adult Health
